# Patient Record
Sex: Male | Born: 1980 | Race: Black or African American | Hispanic: No | Marital: Married | Smoking: Never smoker
Health system: Southern US, Community
[De-identification: ages and names within clinical notes are randomized; demographics above are authoritative.]

## PROBLEM LIST (undated history)

## (undated) DIAGNOSIS — F329 Major depressive disorder, single episode, unspecified: Secondary | ICD-10-CM

## (undated) DIAGNOSIS — D689 Coagulation defect, unspecified: Secondary | ICD-10-CM

## (undated) DIAGNOSIS — K219 Gastro-esophageal reflux disease without esophagitis: Secondary | ICD-10-CM

## (undated) DIAGNOSIS — R0602 Shortness of breath: Secondary | ICD-10-CM

## (undated) DIAGNOSIS — R011 Cardiac murmur, unspecified: Secondary | ICD-10-CM

## (undated) DIAGNOSIS — J90 Pleural effusion, not elsewhere classified: Secondary | ICD-10-CM

## (undated) DIAGNOSIS — D6851 Activated protein C resistance: Secondary | ICD-10-CM

## (undated) DIAGNOSIS — G43909 Migraine, unspecified, not intractable, without status migrainosus: Secondary | ICD-10-CM

## (undated) DIAGNOSIS — I10 Essential (primary) hypertension: Secondary | ICD-10-CM

## (undated) DIAGNOSIS — F4310 Post-traumatic stress disorder, unspecified: Secondary | ICD-10-CM

## (undated) DIAGNOSIS — F419 Anxiety disorder, unspecified: Secondary | ICD-10-CM

## (undated) DIAGNOSIS — G4700 Insomnia, unspecified: Secondary | ICD-10-CM

## (undated) DIAGNOSIS — S069X9A Unspecified intracranial injury with loss of consciousness of unspecified duration, initial encounter: Secondary | ICD-10-CM

## (undated) DIAGNOSIS — E785 Hyperlipidemia, unspecified: Secondary | ICD-10-CM

## (undated) DIAGNOSIS — F32A Depression, unspecified: Secondary | ICD-10-CM

## (undated) DIAGNOSIS — I2699 Other pulmonary embolism without acute cor pulmonale: Secondary | ICD-10-CM

## (undated) HISTORY — DX: Cardiac murmur, unspecified: R01.1

## (undated) HISTORY — DX: Depression, unspecified: F32.A

## (undated) HISTORY — DX: Essential (primary) hypertension: I10

## (undated) HISTORY — DX: Insomnia, unspecified: G47.00

## (undated) HISTORY — DX: Coagulation defect, unspecified: D68.9

## (undated) HISTORY — DX: Hyperlipidemia, unspecified: E78.5

## (undated) HISTORY — DX: Major depressive disorder, single episode, unspecified: F32.9

## (undated) HISTORY — DX: Anxiety disorder, unspecified: F41.9

---

## 2008-05-11 DIAGNOSIS — S069X9A Unspecified intracranial injury with loss of consciousness of unspecified duration, initial encounter: Secondary | ICD-10-CM

## 2008-05-11 DIAGNOSIS — S069XAA Unspecified intracranial injury with loss of consciousness status unknown, initial encounter: Secondary | ICD-10-CM

## 2008-05-11 HISTORY — DX: Unspecified intracranial injury with loss of consciousness status unknown, initial encounter: S06.9XAA

## 2008-05-11 HISTORY — DX: Unspecified intracranial injury with loss of consciousness of unspecified duration, initial encounter: S06.9X9A

## 2010-05-02 ENCOUNTER — Emergency Department (HOSPITAL_COMMUNITY)
Admission: EM | Admit: 2010-05-02 | Discharge: 2010-05-03 | Payer: Self-pay | Source: Home / Self Care | Admitting: Emergency Medicine

## 2010-05-14 ENCOUNTER — Emergency Department (HOSPITAL_COMMUNITY)
Admission: EM | Admit: 2010-05-14 | Discharge: 2010-05-14 | Payer: Self-pay | Source: Home / Self Care | Admitting: Family Medicine

## 2010-07-21 LAB — COMPREHENSIVE METABOLIC PANEL
CO2: 25 mEq/L (ref 19–32)
Calcium: 9 mg/dL (ref 8.4–10.5)
GFR calc Af Amer: >60 mL/min (ref >60)
GFR calc non Af Amer: >60 mL/min (ref >60)
Glucose, Bld: 79 mg/dL (ref 70–99)
Potassium: 3.5 mEq/L (ref 3.5–5.1)
Sodium: 135 mEq/L (ref 135–145)
Total Bilirubin: 0.2 mg/dL — ABNORMAL LOW (ref 0.3–1.2)

## 2010-07-21 LAB — CBC
MCH: 28.7 pg (ref 26.0–34.0)
MCHC: 34.8 g/dL (ref 30.0–36.0)
MCV: 82.5 fL (ref 78.0–100.0)
RBC: 5.19 MIL/uL (ref 4.22–5.81)
RDW: 13 % (ref 11.5–15.5)

## 2010-07-21 LAB — DIFFERENTIAL
Basophils Relative: 0 % (ref 0–1)
Eosinophils Absolute: 0.1 10*3/uL (ref 0.0–0.7)
Eosinophils Relative: 1 % (ref 0–5)
Lymphocytes Relative: 66 % — ABNORMAL HIGH (ref 12–46)
Monocytes Absolute: 0.7 10*3/uL (ref 0.1–1.0)
Monocytes Relative: 8 % (ref 3–12)
Neutro Abs: 2.2 10*3/uL (ref 1.7–7.7)

## 2010-07-21 LAB — URINALYSIS, ROUTINE W REFLEX MICROSCOPIC
Bilirubin Urine: NEGATIVE
Glucose, UA: NEGATIVE mg/dL
Hgb urine dipstick: NEGATIVE
Specific Gravity, Urine: 1.03 (ref 1.005–1.030)

## 2010-07-21 LAB — APTT: aPTT: 43 seconds — ABNORMAL HIGH (ref 24–37)

## 2010-07-21 LAB — PROTIME-INR: INR: 1.22 (ref 0.00–1.49)

## 2011-04-23 ENCOUNTER — Emergency Department (HOSPITAL_COMMUNITY)
Admission: EM | Admit: 2011-04-23 | Discharge: 2011-04-23 | Disposition: A | Discharge status: Home / Self Care | Attending: Emergency Medicine | Admitting: Emergency Medicine

## 2011-04-23 ENCOUNTER — Encounter: Payer: Self-pay | Admitting: *Deleted

## 2011-04-23 ENCOUNTER — Emergency Department (HOSPITAL_COMMUNITY)

## 2011-04-23 DIAGNOSIS — K921 Melena: Secondary | ICD-10-CM | POA: Insufficient documentation

## 2011-04-23 DIAGNOSIS — R0602 Shortness of breath: Secondary | ICD-10-CM | POA: Insufficient documentation

## 2011-04-23 DIAGNOSIS — Z86718 Personal history of other venous thrombosis and embolism: Secondary | ICD-10-CM | POA: Insufficient documentation

## 2011-04-23 DIAGNOSIS — R0781 Pleurodynia: Secondary | ICD-10-CM

## 2011-04-23 DIAGNOSIS — R071 Chest pain on breathing: Secondary | ICD-10-CM | POA: Insufficient documentation

## 2011-04-23 DIAGNOSIS — R0601 Orthopnea: Secondary | ICD-10-CM | POA: Insufficient documentation

## 2011-04-23 DIAGNOSIS — R079 Chest pain, unspecified: Secondary | ICD-10-CM | POA: Insufficient documentation

## 2011-04-23 HISTORY — DX: Other pulmonary embolism without acute cor pulmonale: I26.99

## 2011-04-23 HISTORY — DX: Pleural effusion, not elsewhere classified: J90

## 2011-04-23 HISTORY — DX: Post-traumatic stress disorder, unspecified: F43.10

## 2011-04-23 HISTORY — DX: Migraine, unspecified, not intractable, without status migrainosus: G43.909

## 2011-04-23 LAB — BASIC METABOLIC PANEL
BUN: 12 mg/dL (ref 6–23)
CO2: 29 mEq/L (ref 19–32)
Calcium: 10.2 mg/dL (ref 8.4–10.5)
Chloride: 99 mEq/L (ref 96–112)
GFR calc non Af Amer: >90 mL/min (ref >90)
Potassium: 3.7 mEq/L (ref 3.5–5.1)
Sodium: 137 mEq/L (ref 135–145)

## 2011-04-23 LAB — CBC
HCT: 45.2 % (ref 39.0–52.0)
MCV: 81.6 fL (ref 78.0–100.0)
Platelets: 329 10*3/uL (ref 150–400)

## 2011-04-23 LAB — DIFFERENTIAL
Basophils Absolute: 0 10*3/uL (ref 0.0–0.1)
Basophils Relative: 0 % (ref 0–1)
Neutro Abs: 1.6 10*3/uL — ABNORMAL LOW (ref 1.7–7.7)

## 2011-04-23 LAB — OCCULT BLOOD X 1 CARD TO LAB, STOOL: Fecal Occult Bld: POSITIVE

## 2011-04-23 LAB — APTT: aPTT: 29 seconds (ref 24–37)

## 2011-04-23 MED ORDER — MORPHINE SULFATE 2 MG/ML IJ SOLN
INTRAMUSCULAR | Status: AC
  Filled 2011-04-23: qty 2

## 2011-04-23 MED ORDER — MORPHINE SULFATE 4 MG/ML IJ SOLN
4.0000 mg | Freq: Once | INTRAMUSCULAR | Status: AC
  Administered 2011-04-23: 4 mg via INTRAVENOUS

## 2011-04-23 MED ORDER — IOHEXOL 300 MG/ML  SOLN
100.0000 mL | Freq: Once | INTRAMUSCULAR | Status: AC | PRN
  Administered 2011-04-23: 100 mL via INTRAVENOUS

## 2011-04-23 MED ORDER — HYDROCODONE-ACETAMINOPHEN 5-500 MG PO TABS: 1.0000 | ORAL_TABLET | Freq: Four times a day (QID) | ORAL | Status: AC | PRN

## 2011-04-23 NOTE — ED Notes (Signed)
Report received from p.m. Shift---c/o SOA and pain thru chest as well as generalized abdominal pain--Gives history for P.E.'s--has been on Coumadin in the past but, not in a while--also reports bright red blood in stool---pulse ox is 99% on room air, lungs CTA

## 2011-04-23 NOTE — ED Notes (Signed)
Pt states "have had a cough x 2 wks, last wk coughed up white, had blood in stool & on toilet paper"; pt NAD

## 2011-04-23 NOTE — ED Provider Notes (Signed)
History     CSN: [id number] Arrival date & time: 04/23/2011  4:47 PM   First MD Initiated Contact with Patient 04/23/11 1937      Chief Complaint  Patient presents with  . Shortness of Breath    (Consider location/radiation/quality/duration/timing/severity/associated sxs/prior treatment) HPI Comments: States has history of pe, diagnosed at another facility about one year ago.  Was worked up for hypercoagulable state, no cause found.  Was on lovenox injections as coumadin was unable to achieve therapeutic results.  Started with blood in stool pain in the front of the chest, and shortness of breath which is what he had with previous pe.    Patient is a 30 y.o. male presenting with shortness of breath. The history is provided by the patient.  Shortness of Breath  The current episode started today. The problem has been gradually worsening. The problem is moderate. The symptoms are relieved by nothing. The symptoms are aggravated by nothing. Associated symptoms include chest pain, orthopnea and shortness of breath. Pertinent negatives include no fever, no stridor and no cough.    Past Medical History  Diagnosis Date  . Pulmonary embolism   . Pleural effusion   . PTSD (post-traumatic stress disorder)   . Migraines     History reviewed. No pertinent past surgical history.  No family history on file.  History  Substance Use Topics  . Smoking status: Never Smoker   . Smokeless tobacco: Not on file  . Alcohol Use: 1.2 oz/week    2 Glasses of wine per week      Review of Systems  Constitutional: Negative for fever.  Respiratory: Positive for shortness of breath. Negative for cough and stridor.   Cardiovascular: Positive for chest pain and orthopnea.  All other systems reviewed and are negative.    Allergies  Review of patient's allergies indicates no known allergies.  Home Medications   Current Outpatient Rx  Name Route Sig Dispense Refill  . CITALOPRAM HYDROBROMIDE 20  MG PO TABS Oral Take 20 mg by mouth daily.      Marland Kitchen PRAZOSIN HCL 5 MG PO CAPS Oral Take 20 mg by mouth at bedtime.      . TRAMADOL HCL 50 MG PO TABS Oral Take 50 mg by mouth every 6 (six) hours as needed. Maximum dose= 8 tablets per day; for pain.       BP 150/94  Pulse 98  Temp(Src) 98 F (36.7 C) (Oral)  Resp 18  Wt 193 lb (87.544 kg)  SpO2 98%  Physical Exam  Constitutional: He is oriented to person, place, and time. He appears well-developed and well-nourished. No distress.  HENT:  Head: Normocephalic and atraumatic.  Neck: Normal range of motion. Neck supple.  Cardiovascular: Normal rate and regular rhythm.  Exam reveals no gallop and no friction rub.   No murmur heard. Pulmonary/Chest: Effort normal and breath sounds normal. No respiratory distress. He has no wheezes.  Abdominal: Soft. Bowel sounds are normal. He exhibits no distension. There is no tenderness.  Musculoskeletal: Normal range of motion. He exhibits no edema.  Neurological: He is alert and oriented to person, place, and time.  Skin: Skin is warm and dry.    ED Course  Procedures (including critical care time)   Labs Reviewed  CBC  PROTIME-INR  BASIC METABOLIC PANEL  D-DIMER, QUANTITATIVE  DIFFERENTIAL  APTT  OCCULT BLOOD X 1 CARD TO LAB, STOOL   Dg Chest 2 View  04/23/2011  *RADIOLOGY REPORT*  Clinical Data: Shortness  of breath  CHEST - 2 VIEW  Comparison: 05/03/2010 and 05/02/2010  Findings: Cardiomediastinal silhouette is stable.  No acute infiltrate or pleural effusion.  No pulmonary edema.  Bony thorax is stable.  IMPRESSION: No active disease.  No significant change.  Original Report Authenticated By: Natasha Mead, M.D.     No diagnosis found.   Date: 04/23/2011  Rate: 92  Rhythm: normal sinus rhythm  QRS Axis: normal  Intervals: normal  ST/T Wave abnormalities: normal  Conduction Disutrbances:none  Narrative Interpretation:   Old EKG Reviewed: none available    MDM  The patient  presented complaining of pain in the front, left lower ribcage.  It was pleuritic in nature and was also complaining of having blood in his stool.  He tells me this is how he presented with a PE in the past.  Workup into PE was negative and no other cause for his symptoms was found on his CT scan or other tests.  His Hb was 16.  At this point, will discharge to home, to follow up with GI if blood continues.          Geoffery Lyons, MD 04/23/11 2136

## 2011-05-12 ENCOUNTER — Emergency Department (INDEPENDENT_AMBULATORY_CARE_PROVIDER_SITE_OTHER)

## 2011-05-12 ENCOUNTER — Encounter (HOSPITAL_COMMUNITY): Payer: Self-pay | Admitting: *Deleted

## 2011-05-12 ENCOUNTER — Emergency Department (INDEPENDENT_AMBULATORY_CARE_PROVIDER_SITE_OTHER)
Admission: EM | Admit: 2011-05-12 | Discharge: 2011-05-12 | Disposition: A | Discharge status: Home / Self Care | Source: Home / Self Care | Attending: Emergency Medicine | Admitting: Emergency Medicine

## 2011-05-12 DIAGNOSIS — M25579 Pain in unspecified ankle and joints of unspecified foot: Secondary | ICD-10-CM

## 2011-05-12 DIAGNOSIS — M25569 Pain in unspecified knee: Secondary | ICD-10-CM

## 2011-05-12 MED ORDER — MELOXICAM 7.5 MG PO TABS: 7.5000 mg | ORAL_TABLET | Freq: Every day | ORAL | Status: DC

## 2011-05-12 NOTE — ED Notes (Signed)
Left knee pain and left ankle pain onset of original injury April 2011 and reinjured dec 2011 - wrestling with children kicked in knee onset approx 2 days ago

## 2011-05-12 NOTE — ED Provider Notes (Addendum)
History     CSN: [id number]  Arrival date & time 05/12/11  1612   First MD Initiated Contact with Patient 05/12/11 1637      Chief Complaint  Patient presents with  . Knee Pain  . 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 It hurts a lot now"  Patient is a 31 y.o. male presenting with knee pain and ankle pain. The history is provided by the patient.  Knee Pain This is a recurrent problem. The current episode started 2 days ago. The problem occurs constantly. The problem has been rapidly improving. The symptoms are aggravated by twisting, bending and walking. Treatments tried: took one motrin yesterday. The treatment provided no relief.  Ankle Pain  Pertinent negatives include no numbness.    Past Medical History  Diagnosis Date  . Pulmonary embolism   . Pleural effusion   . PTSD (post-traumatic stress disorder)   . Migraines     History reviewed. No pertinent past surgical history.  History reviewed. No pertinent family history.  History  Substance Use Topics  . Smoking status: Never Smoker   . Smokeless tobacco: Not on file  . Alcohol Use: 1.2 oz/week    2 Glasses of wine per week      Review of Systems  Constitutional: Negative for fever.  Musculoskeletal: Negative for joint swelling.       Global knee pain and L ankle  Neurological: Negative for weakness and numbness.    Allergies  Review of patient's allergies indicates no known allergies.  Home Medications   Current Outpatient Rx  Name Route Sig Dispense Refill  . CITALOPRAM HYDROBROMIDE 20 MG PO TABS Oral Take 20 mg by mouth daily.      Marland Kitchen PRAZOSIN HCL 5 MG PO CAPS Oral Take  20 mg by mouth at bedtime.      . MELOXICAM 7.5 MG PO TABS Oral Take 1 tablet (7.5 mg total) by mouth daily. 14 tablet 0  . TRAMADOL HCL 50 MG PO TABS Oral Take 50 mg by mouth every 6 (six) hours as needed. Maximum dose= 8 tablets per day; for pain.       BP 153/95  Pulse 112  Temp(Src) 98.9 F (37.2 C) (Oral)  Resp 18  SpO2 99%  Physical Exam  Nursing note and vitals reviewed. Constitutional: He appears well-developed and well-nourished.  Musculoskeletal: He exhibits tenderness.       Left knee: He exhibits decreased range of motion and bony tenderness. He exhibits no swelling, no effusion, no ecchymosis, no deformity, no erythema, normal alignment, normal meniscus and no MCL laxity. tenderness found. Medial joint line and lateral joint line tenderness noted. No patellar tendon tenderness noted.       Left ankle: He exhibits decreased range of motion. He exhibits no swelling, no ecchymosis, no laceration and normal pulse. tenderness. Lateral malleolus and medial malleolus tenderness found. Achilles tendon normal. Achilles tendon exhibits no pain, no defect and normal Thompson's test results.  Skin: No abrasion, no bruising, no ecchymosis, no laceration and no rash noted. No erythema.    ED Course  Procedures (including critical care time)  Labs Reviewed - No data to display Dg  Ankle Complete Left  05/12/2011  *RADIOLOGY REPORT*  Clinical Data: Twisting injury.  Pain.  LEFT ANKLE COMPLETE - 3+ VIEW  Comparison: None.  Findings: There is no evidence for acute fracture or dislocation. No soft tissue foreign body or gas identified.  IMPRESSION: Negative exam.  Original Report Authenticated By: Patterson Hammersmith, M.D.   Dg Knee Complete 4 Views Left  05/12/2011  *RADIOLOGY REPORT*  Clinical Data: Injury.  Pain.  History of torn meniscus.  LEFT KNEE - COMPLETE 4+ VIEW  Comparison: None.  Findings: Four views are performed, showing presence of joint effusion.  No evidence for acute fracture,  subluxation.  No significant degenerative change.  IMPRESSION: Joint effusion.  If pain persists, consider follow-up MRI to evaluate for internal derangement.  Original Report Authenticated By: Patterson Hammersmith, M.D.     1. Chronic knee pain   2. 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 To establish with local PCP in March. Feels like aggravated these injuries recently while wrestlng with his son 2 days ago. Exam is somewhat non-specific- pain is easily elicited with mild superficial touch- no deformities no sts. Disproportionate lack of ROM- X-rays obtained for hx purpose and for follow-up with orthopedic provider-GIVEN HISTORY OF MENISCAL TEAR AND MILD KNEE EFFUSION- AND RESTRICTED ROM        Jimmie Molly, MD 05/12/11 1759    Jimmie Molly, MD 05/12/11 [phone]

## 2011-07-10 ENCOUNTER — Ambulatory Visit (INDEPENDENT_AMBULATORY_CARE_PROVIDER_SITE_OTHER): Admitting: Licensed Clinical Social Worker

## 2011-07-10 DIAGNOSIS — F4310 Post-traumatic stress disorder, unspecified: Secondary | ICD-10-CM

## 2011-07-10 DIAGNOSIS — F331 Major depressive disorder, recurrent, moderate: Secondary | ICD-10-CM

## 2011-07-10 DIAGNOSIS — F411 Generalized anxiety disorder: Secondary | ICD-10-CM

## 2011-07-13 ENCOUNTER — Telehealth: Payer: Self-pay | Admitting: *Deleted

## 2011-07-13 NOTE — Telephone Encounter (Signed)
Rec'd call back from referring office, patient does not actively have known clots, but due to hx/risk factors of PE/DVTs, Dr Renae Gloss started patient back on injections until we can do consult with further recommendations. Orders/POF to scheduling to establish patient.

## 2011-07-13 NOTE — Telephone Encounter (Signed)
Received referral on 2/22 requesting consult for PE. Messages left with Tawanda in scheduling and Nurse Asher Muir with Dr Renae Gloss for further information and clarification. 3rd attempt made to referring office to retrieve needed information on voice mail for Harold Cohen/Harold Cohen. Appt can not be made until further clarification and records related to diagnosis of PE have been rec'd. Attempted to call patient to get information, no answer.

## 2011-07-15 ENCOUNTER — Telehealth: Payer: Self-pay | Admitting: Hematology and Oncology

## 2011-07-15 NOTE — Telephone Encounter (Signed)
S/w pt re appt for 3/13 @ 10 am

## 2011-07-16 ENCOUNTER — Telehealth: Payer: Self-pay | Admitting: Hematology and Oncology

## 2011-07-16 NOTE — Telephone Encounter (Signed)
Referred by Bebe Liter Dx- HX DVT/PE

## 2011-07-20 ENCOUNTER — Ambulatory Visit (INDEPENDENT_AMBULATORY_CARE_PROVIDER_SITE_OTHER): Admitting: Licensed Clinical Social Worker

## 2011-07-20 DIAGNOSIS — F411 Generalized anxiety disorder: Secondary | ICD-10-CM

## 2011-07-20 DIAGNOSIS — F331 Major depressive disorder, recurrent, moderate: Secondary | ICD-10-CM

## 2011-07-20 DIAGNOSIS — F4310 Post-traumatic stress disorder, unspecified: Secondary | ICD-10-CM

## 2011-07-22 ENCOUNTER — Ambulatory Visit (HOSPITAL_BASED_OUTPATIENT_CLINIC_OR_DEPARTMENT_OTHER): Admitting: Hematology and Oncology

## 2011-07-22 ENCOUNTER — Ambulatory Visit

## 2011-07-22 ENCOUNTER — Encounter: Payer: Self-pay | Admitting: Hematology and Oncology

## 2011-07-22 ENCOUNTER — Other Ambulatory Visit: Payer: Self-pay | Admitting: *Deleted

## 2011-07-22 ENCOUNTER — Ambulatory Visit (HOSPITAL_BASED_OUTPATIENT_CLINIC_OR_DEPARTMENT_OTHER): Admitting: Lab

## 2011-07-22 ENCOUNTER — Telehealth: Payer: Self-pay | Admitting: Hematology and Oncology

## 2011-07-22 VITALS — BP 129/82 | HR 88 | Temp 97.0°F | Ht 72.0 in | Wt 194.5 lb

## 2011-07-22 DIAGNOSIS — I749 Embolism and thrombosis of unspecified artery: Secondary | ICD-10-CM

## 2011-07-22 DIAGNOSIS — Z7901 Long term (current) use of anticoagulants: Secondary | ICD-10-CM

## 2011-07-22 DIAGNOSIS — Z86711 Personal history of pulmonary embolism: Secondary | ICD-10-CM

## 2011-07-22 LAB — CBC WITH DIFFERENTIAL/PLATELET
Basophils Absolute: 0 10*3/uL (ref 0.0–0.1)
EOS%: 1 % (ref 0.0–7.0)
Eosinophils Absolute: 0 10*3/uL (ref 0.0–0.5)
HGB: 15.9 g/dL (ref 13.0–17.1)
LYMPH%: 59 % — ABNORMAL HIGH (ref 14.0–49.0)
MCH: 28.7 pg (ref 27.2–33.4)
MCV: 86.8 fL (ref 79.3–98.0)
MONO%: 6.3 % (ref 0.0–14.0)
NEUT#: 1.6 10*3/uL (ref 1.5–6.5)
Platelets: 289 10*3/uL (ref 140–400)

## 2011-07-22 MED ORDER — ENOXAPARIN SODIUM 120 MG/0.8ML ~~LOC~~ SOLN: 120.0000 mg | SUBCUTANEOUS | Status: DC

## 2011-07-22 NOTE — Telephone Encounter (Signed)
appts made and printed for pt and pt sent back to the lab

## 2011-07-22 NOTE — Patient Instructions (Signed)
Patient to follow up as instructed.   Current Outpatient Prescriptions  Medication Sig Dispense Refill  . atorvastatin (LIPITOR) 10 MG tablet Take 10 mg by mouth daily.      Marland Kitchen bismuth-metronidazole-tetracycline (PLYERA) 140-125-125 MG per capsule Take 3 capsules by mouth 4 (four) times daily.      . citalopram (CELEXA) 20 MG tablet Take 20 mg by mouth daily.        Marland Kitchen enoxaparin (LOVENOX) 40 MG/0.4ML SOLN Inject 40 mg into the skin daily.      . ergocalciferol (VITAMIN D2) 50000 UNITS capsule Take 50,000 Units by mouth as directed. Take 16109 units on  Tues.  &  Thurs.      . Multiple Vitamin (MULTIVITAMIN) tablet Take 2 tablets by mouth daily.      Marland Kitchen omeprazole (PRILOSEC) 40 MG capsule Take 40 mg by mouth daily.      . prazosin (MINIPRESS) 2 MG capsule Take 8 mg by mouth at bedtime.      Marland Kitchen QUEtiapine (SEROQUEL) 25 MG tablet Take 25 mg by mouth 2 (two) times daily.      . traMADol (ULTRAM) 50 MG tablet Take 50 mg by mouth every 6 (six) hours as needed. Maximum dose= 8 tablets per day; for pain.             March 2013  Sunday Monday Tuesday Wednesday Thursday Friday Saturday                  1   2    3   4   5   6   7   8   9    10   11   12   13    FINANCIAL COUNSELING  10:00 AM  (30 min.)  [facility]  [facility]   NEW PATIENT 60  10:30 AM  (60 min.)  Jontavia Leatherbury I Anani Gu, MD  [facility] 14   CT BODY W/  11:30 AM  (30 min.)  Wl-Ct 2  [facility] 15   16    17   18   19   20   21    EST PT 30  10:30 AM  (30 min.)  Laurice Record, MD  [facility] 22   23    24   25   26   27   28   29   30    31                            April 2013  Sunday Monday Tuesday Wednesday Thursday Friday Saturday      1   2   3   4   5   6    7   8   9   10   11    NEW PATIENT  10:20 AM  (20 min.)  Wilmon Arms. Corliss Skains, MD  [facility] 12   13    14   15   16   17   18   19   20    21   22   23   24   25   26   27    28   29    30

## 2011-07-22 NOTE — Progress Notes (Signed)
Dr.    Maggie Font      -        Primary. Dr.    Chip Boer      -      GI Dr.    Thomasena Edis        [facility]  Orthopedic Dr.    Lottie Dawson     [facility] [place]   Neurologic    [place]  [facility]   On   [address]  Cell    Phone       [phone].

## 2011-07-22 NOTE — Progress Notes (Signed)
This office note has been dictated.

## 2011-07-23 ENCOUNTER — Other Ambulatory Visit: Payer: Self-pay

## 2011-07-23 ENCOUNTER — Ambulatory Visit (HOSPITAL_COMMUNITY)
Admission: RE | Admit: 2011-07-23 | Discharge: 2011-07-23 | Disposition: A | Discharge status: Home / Self Care | Source: Ambulatory Visit | Attending: Hematology and Oncology | Admitting: Hematology and Oncology

## 2011-07-23 ENCOUNTER — Emergency Department (HOSPITAL_COMMUNITY)

## 2011-07-23 ENCOUNTER — Encounter (HOSPITAL_COMMUNITY): Payer: Self-pay

## 2011-07-23 ENCOUNTER — Emergency Department (HOSPITAL_COMMUNITY)
Admission: EM | Admit: 2011-07-23 | Discharge: 2011-07-23 | Disposition: A | Discharge status: Home / Self Care | Attending: Emergency Medicine | Admitting: Emergency Medicine

## 2011-07-23 DIAGNOSIS — K429 Umbilical hernia without obstruction or gangrene: Secondary | ICD-10-CM | POA: Insufficient documentation

## 2011-07-23 DIAGNOSIS — N400 Enlarged prostate without lower urinary tract symptoms: Secondary | ICD-10-CM | POA: Insufficient documentation

## 2011-07-23 DIAGNOSIS — R000 Tachycardia, unspecified: Secondary | ICD-10-CM | POA: Insufficient documentation

## 2011-07-23 DIAGNOSIS — R110 Nausea: Secondary | ICD-10-CM | POA: Insufficient documentation

## 2011-07-23 DIAGNOSIS — E860 Dehydration: Secondary | ICD-10-CM | POA: Insufficient documentation

## 2011-07-23 DIAGNOSIS — Z79899 Other long term (current) drug therapy: Secondary | ICD-10-CM | POA: Insufficient documentation

## 2011-07-23 DIAGNOSIS — R109 Unspecified abdominal pain: Secondary | ICD-10-CM | POA: Insufficient documentation

## 2011-07-23 DIAGNOSIS — R0602 Shortness of breath: Secondary | ICD-10-CM | POA: Insufficient documentation

## 2011-07-23 DIAGNOSIS — K7689 Other specified diseases of liver: Secondary | ICD-10-CM | POA: Insufficient documentation

## 2011-07-23 DIAGNOSIS — R197 Diarrhea, unspecified: Secondary | ICD-10-CM | POA: Insufficient documentation

## 2011-07-23 DIAGNOSIS — R0789 Other chest pain: Secondary | ICD-10-CM | POA: Insufficient documentation

## 2011-07-23 DIAGNOSIS — Z86718 Personal history of other venous thrombosis and embolism: Secondary | ICD-10-CM | POA: Insufficient documentation

## 2011-07-23 DIAGNOSIS — I749 Embolism and thrombosis of unspecified artery: Secondary | ICD-10-CM

## 2011-07-23 DIAGNOSIS — K625 Hemorrhage of anus and rectum: Secondary | ICD-10-CM | POA: Insufficient documentation

## 2011-07-23 LAB — TROPONIN I: Troponin I: <0.3 ng/mL (ref <0.30)

## 2011-07-23 LAB — DIFFERENTIAL
Eosinophils Absolute: 0 10*3/uL (ref 0.0–0.7)
Eosinophils Relative: 0 % (ref 0–5)
Lymphs Abs: 3.5 10*3/uL (ref 0.7–4.0)
Monocytes Absolute: 0.7 10*3/uL (ref 0.1–1.0)
Monocytes Relative: 9 % (ref 3–12)
Neutrophils Relative %: 46 % (ref 43–77)

## 2011-07-23 LAB — COMPREHENSIVE METABOLIC PANEL
ALT: 39 U/L (ref 0–53)
AST: 40 U/L — ABNORMAL HIGH (ref 0–37)
Albumin: 5 g/dL (ref 3.5–5.2)
Alkaline Phosphatase: 49 U/L (ref 39–117)
BUN: 9 mg/dL (ref 6–23)
CO2: 26 mEq/L (ref 19–32)
Calcium: 10.5 mg/dL (ref 8.4–10.5)
Chloride: 99 mEq/L (ref 96–112)
Creatinine, Ser: 0.87 mg/dL (ref 0.50–1.35)
GFR calc Af Amer: >90 mL/min (ref >90)
GFR calc non Af Amer: >90 mL/min (ref >90)
Glucose, Bld: 150 mg/dL — ABNORMAL HIGH (ref 70–99)
Potassium: 3.8 mEq/L (ref 3.5–5.1)
Sodium: 139 mEq/L (ref 135–145)
Total Bilirubin: 0.3 mg/dL (ref 0.3–1.2)
Total Protein: 9.2 g/dL — ABNORMAL HIGH (ref 6.0–8.3)

## 2011-07-23 LAB — CBC
HCT: 49.7 % (ref 39.0–52.0)
Hemoglobin: 17.3 g/dL — ABNORMAL HIGH (ref 13.0–17.0)
MCH: 29.1 pg (ref 26.0–34.0)
MCHC: 34.8 g/dL (ref 30.0–36.0)
MCV: 83.7 fL (ref 78.0–100.0)
Platelets: 353 10*3/uL (ref 150–400)
RBC: 5.94 MIL/uL — ABNORMAL HIGH (ref 4.22–5.81)
RDW: 12.7 % (ref 11.5–15.5)
WBC: 7.7 10*3/uL (ref 4.0–10.5)

## 2011-07-23 LAB — LIPASE, BLOOD: Lipase: 47 U/L (ref 11–59)

## 2011-07-23 LAB — PROTIME-INR: Prothrombin Time: 12.4 seconds (ref 11.6–15.2)

## 2011-07-23 LAB — RAPID URINE DRUG SCREEN, HOSP PERFORMED
Barbiturates: NOT DETECTED
Cocaine: NOT DETECTED

## 2011-07-23 MED ORDER — SODIUM CHLORIDE 0.9 % IV BOLUS (SEPSIS)
1000.0000 mL | Freq: Once | INTRAVENOUS | Status: AC
  Administered 2011-07-23: 1000 mL via INTRAVENOUS

## 2011-07-23 MED ORDER — LORAZEPAM 2 MG/ML IJ SOLN: 1.0000 mg | Freq: Once | INTRAMUSCULAR | Status: DC

## 2011-07-23 MED ORDER — ASPIRIN 81 MG PO CHEW
324.0000 mg | CHEWABLE_TABLET | Freq: Once | ORAL | Status: AC
  Administered 2011-07-23: 324 mg via ORAL
  Filled 2011-07-23: qty 4

## 2011-07-23 MED ORDER — IOHEXOL 300 MG/ML  SOLN
100.0000 mL | Freq: Once | INTRAMUSCULAR | Status: AC | PRN
  Administered 2011-07-23: 100 mL via INTRAVENOUS

## 2011-07-23 MED ORDER — METOCLOPRAMIDE HCL 5 MG/ML IJ SOLN: 10.0000 mg | Freq: Once | INTRAMUSCULAR | Status: AC

## 2011-07-23 MED ORDER — NITROGLYCERIN 0.4 MG SL SUBL
0.4000 mg | SUBLINGUAL_TABLET | SUBLINGUAL | Status: DC | PRN
  Administered 2011-07-23: 0.4 mg via SUBLINGUAL
  Filled 2011-07-23: qty 25

## 2011-07-23 MED ORDER — ONDANSETRON HCL 4 MG/2ML IJ SOLN
4.0000 mg | Freq: Once | INTRAMUSCULAR | Status: AC
  Administered 2011-07-23: 4 mg via INTRAVENOUS
  Filled 2011-07-23: qty 2

## 2011-07-23 MED ORDER — METOCLOPRAMIDE HCL 5 MG/ML IJ SOLN
INTRAMUSCULAR | Status: AC
  Administered 2011-07-23: 15:00:00
  Filled 2011-07-23: qty 2

## 2011-07-23 MED ORDER — IOHEXOL 300 MG/ML  SOLN
100.0000 mL | Freq: Once | INTRAMUSCULAR | Status: AC | PRN
  Administered 2011-07-23: 80 mL via INTRAVENOUS

## 2011-07-23 MED ORDER — MORPHINE SULFATE 4 MG/ML IJ SOLN: 4.0000 mg | Freq: Once | INTRAMUSCULAR | Status: AC

## 2011-07-23 MED ORDER — MORPHINE SULFATE 4 MG/ML IJ SOLN
INTRAMUSCULAR | Status: AC
  Administered 2011-07-23: 15:00:00
  Filled 2011-07-23: qty 1

## 2011-07-23 MED ORDER — MORPHINE SULFATE 4 MG/ML IJ SOLN
4.0000 mg | Freq: Once | INTRAMUSCULAR | Status: AC
  Administered 2011-07-23: 4 mg via INTRAVENOUS
  Filled 2011-07-23: qty 1

## 2011-07-23 MED ORDER — ALPRAZOLAM 0.5 MG PO TABS: 0.5000 mg | ORAL_TABLET | Freq: Three times a day (TID) | ORAL | Status: AC | PRN

## 2011-07-23 NOTE — Progress Notes (Signed)
CC:   Robyn N. Allyne Gee, M.D. Jordan Hawks Elnoria Howard, MD [facility]  IDENTIFYING STATEMENT:  The patient is a 31 year old man seen at the request of Dr. Lelon Perla with history of pulmonary embolism.  HISTORY OF PRESENT ILLNESS:  The patient states that he had initially presented with chest pain and was diagnosed with bilateral pulmonary emboli in October 2001.  He was hospitalized for approximately 2 weeks in [place], [place], at [facility].  He was managed with heparin and eventually bridged to Coumadin for a year.  He reports that lower extremity deep vein thrombosis was negative for blood clots.  He then reports that following a year of anticoagulation, blood work had determined that he required lifelong anticoagulation.  He is unsure of which coagulation factors were affected.  He does not have office records at hand.  He was placed on Lovenox 40 mg daily 2 weeks ago by Dr. Zella Ball PA. He also gives a 21-month history of rectal bleeding associated with rectal discomfort on defecation.  He is also notes nausea and vomiting. On April 23, 2011, he had received a CT angiogram that had shown no evidence for acute pulmonary emboli.  In addition, his heart and lungs were unremarkable.  There was no adenopathy.  The patient reports that Dr. Lelon Perla had referred him to see Dr. Elnoria Howard for a GI evaluation.  On 06/23/2011, he received an endoscopy and colonoscopy.  I do not have those results, but the patient reports that on endoscopy he was found to be H pylori positive.  He is on antibiotics and a proton pump inhibitor.  Colonoscopy had revealed internal hemorrhoids.  He has a history of posttraumatic stress disorder.  He has serviced [place], [place], and [place]. He was medically discharged in September 2012.  PAST MEDICAL HISTORY: 1. Posttraumatic stress disorder with anxiety. 2. Bilateral pulmonary emboli diagnosed in 2011. 3. H pylori gastritis.  ALLERGIES:   None.  MEDICATIONS:  Lovenox 40 mg daily, multivitamin 2 tablets daily, Celexa 20 mg daily, vitamin D2 50,000 units Tuesday and Thursday, prazosin 2 mg 1 cap q.h.s., Pylera 140 mg 2 caps q.i.d., Prilosec 40 mg daily, Lipitor 10 mg daily, Seroquel 25 mg b.i.d.  SOCIAL HISTORY:  The patient is married with 1 child.  He denies tobacco use.  He drinks socially.  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  He served in [place], [place], and [place].  FAMILY HISTORY:  The patient's mother is felt to have a hypercoagulable factor deficiency.  There is no family history for oncologic or hematologic malignancies.  REVIEW OF SYSTEMS:  Constitutional:  Denies fever, chills, night sweats, anorexia, weight loss.  Cardiovascular:  Denies chest pain, PND, orthopnea, ankle swelling.  Respirations:  Denies cough, hemoptysis, wheeze, shortness of breath.  GI:  Denies nausea, vomiting, abdominal pain, diarrhea, melena, hematochezia.  GU:  Denies dysuria, hematuria, nocturia, frequency.  Skin:  No bruising or bleeding.  Rest of review of systems negative.  PHYSICAL EXAMINATION:  General:  The patient is a well-appearing, well- nourished man in no distress.  Vitals:  Pulse 88, blood pressure 129/82, temperature 97, respirations 20, weight 194 pounds.  HEENT:  Head is atraumatic, normocephalic.  Sclerae anicteric.  Mouth moist.  Neck: Supple.  Chest:  Clear to percussion and auscultation.  CVS:  First and second heart sounds present.  No added sounds or murmurs.  Abdomen: Soft and nontender.  Bowel sounds present.  Extremities:  No calf tenderness.  Pulses present,  symmetrical.  CNS:  Nonfocal.  IMPRESSION AND PLAN:  Mr. Longenecker is a 31 year old man with a history of bilateral pulmonary emboli diagnosed in October 2011 in [place], [place].  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  The patient is currently on prophylactic dose of Lovenox initiated 2 weeks ago.  He needs to be on a higher dose at 1.5 mg/kg daily, which comes up to about 120 mg daily.  In the interim, the patient will return to the lab to have a full hypercoagulable workup and to also include a CBC with comprehensive metabolic panel.  The patient will also have a CT scan of the abdomen and pelvis, as he is complaining of low abdominal discomfort.  Also refer him to general surgeon because he appears to have what sounds like painful internal hemorrhoids.  The patient returns in a week's time to discuss the lab and CT scan findings.    ______________________________ Laurice Record, M.D. LIO/MEDQ  D:  07/22/2011  T:  07/22/2011  Job:  [id number]

## 2011-07-23 NOTE — Discharge Instructions (Signed)
Chest Pain (Nonspecific) It is often hard to give a specific diagnosis for the cause of chest pain. There is always a chance that your pain could be related to something serious, such as a heart attack or a blood clot in the lungs. You need to follow up with your caregiver for further evaluation. CAUSES   Heartburn.   Pneumonia or bronchitis.   Anxiety or stress.   Inflammation around your heart (pericarditis) or lung (pleuritis or pleurisy).   A blood clot in the lung.   A collapsed lung (pneumothorax). It can develop suddenly on its own (spontaneous pneumothorax) or from injury (trauma) to the chest.   Shingles infection (herpes zoster virus).  The chest wall is composed of bones, muscles, and cartilage. Any of these can be the source of the pain.  The bones can be bruised by injury.   The muscles or cartilage can be strained by coughing or overwork.   The cartilage can be affected by inflammation and become sore (costochondritis).  DIAGNOSIS  Lab tests or other studies, such as X-rays, electrocardiography, stress testing, or cardiac imaging, may be needed to find the cause of your pain.  TREATMENT   Treatment depends on what may be causing your chest pain. Treatment may include:   Acid blockers for heartburn.   Anti-inflammatory medicine.   Pain medicine for inflammatory conditions.   Antibiotics if an infection is present.   You may be advised to change lifestyle habits. This includes stopping smoking and avoiding alcohol, caffeine, and chocolate.   You may be advised to keep your head raised (elevated) when sleeping. This reduces the chance of acid going backward from your stomach into your esophagus.   Most of the time, nonspecific chest pain will improve within 2 to 3 days with rest and mild pain medicine.  HOME CARE INSTRUCTIONS   If antibiotics were prescribed, take your antibiotics as directed. Finish them even if you start to feel better.   For the next few  days, avoid physical activities that bring on chest pain. Continue physical activities as directed.   Do not smoke.   Avoid drinking alcohol.   Only take over-the-counter or prescription medicine for pain, discomfort, or fever as directed by your caregiver.   Follow your caregiver's suggestions for further testing if your chest pain does not go away.   Keep any follow-up appointments you made. If you do not go to an appointment, you could develop lasting (chronic) problems with pain. If there is any problem keeping an appointment, you must call to reschedule.  SEEK MEDICAL CARE IF:   You think you are having problems from the medicine you are taking. Read your medicine instructions carefully.   Your chest pain does not go away, even after treatment.   You develop a rash with blisters on your chest.  SEEK IMMEDIATE MEDICAL CARE IF:   You have increased chest pain or pain that spreads to your arm, neck, jaw, back, or abdomen.   You develop shortness of breath, an increasing cough, or you are coughing up blood.   You have severe back or abdominal pain, feel nauseous, or vomit.   You develop severe weakness, fainting, or chills.   You have a fever.  THIS IS AN EMERGENCY. Do not wait to see if the pain will go away. Get medical help at once. Call your local emergency services (911 in [place] Do not drive yourself to the hospital. MAKE SURE YOU:   Understand these instructions.     Will watch your condition.   Will get help right away if you are not doing well or get worse.  Document Released: 02/04/2005 Document Revised: 04/16/2011 Document Reviewed: 12/01/2007 [employer] Patient Information 2012 [employer]. 

## 2011-07-23 NOTE — ED Notes (Signed)
Pt states had a CT scan done this am of abd and pelvic, states saw PCP d/t chest pressure x1wk. On arrival pt is diaphoretic and vomiting. EKG done on arrival.

## 2011-07-23 NOTE — ED Notes (Signed)
Pt states he does not need ativan. Pt state he is ok. Pt hr stable at this time

## 2011-07-23 NOTE — ED Provider Notes (Signed)
History     CSN: [id number]  Arrival date & time 07/23/11  1350   First MD Initiated Contact with Patient 07/23/11 1425      Chief Complaint  Patient presents with  . Chest Pain    (Consider location/radiation/quality/duration/timing/severity/associated sxs/prior treatment) HPI  30yoM h/o PE on lovenox, PTSD, migraines pw chest tightness. Patient states that he woke up this morning with left chest tightness. States that it has gradually worsened throughout the day. C/O SOB only when pain severe. +nausea and NBNB emesis in ED only. +diaphoresis. Denies diarrhea. States that he's experienced BRB in his stool x one month and recent EGD/colonoscopy unremarkable x internal hemorrhoids. He was restarted on lovenox 2 weeks ago (after being off anticoag  Since Sept 2012). No leg pain/swelling. States that he's experienced LUQ pain x "a while now" and today had CT A/P done.   Non smoker, no h/o HTN, DM +HLD, +fmhx CAD  Recently diagnosed with h. pylori  Past Medical History  Diagnosis Date  . Pulmonary embolism   . Pleural effusion   . PTSD (post-traumatic stress disorder)   . Migraines     History reviewed. No pertinent past surgical history.  No family history on file.  History  Substance Use Topics  . Smoking status: Never Smoker   . Smokeless tobacco: Not on file  . Alcohol Use: 1.2 oz/week    2 Glasses of wine per week      Review of Systems  All other systems reviewed and are negative.   except as noted HPI   Allergies  Review of patient's allergies indicates no known allergies.  Home Medications   Current Outpatient Rx  Name Route Sig Dispense Refill  . ALPRAZOLAM 0.5 MG PO TABS Oral Take 1 tablet (0.5 mg total) by mouth 3 (three) times daily as needed for sleep. 10 tablet 0  . ATORVASTATIN CALCIUM 10 MG PO TABS Oral Take 10 mg by mouth daily.    Marland Kitchen BIS SUBCIT-METRONID-TETRACYC 140-125-125 MG PO CAPS Oral Take 3 capsules by mouth 4 (four) times daily.    Marland Kitchen  CITALOPRAM HYDROBROMIDE 20 MG PO TABS Oral Take 20 mg by mouth daily.      Marland Kitchen ENOXAPARIN SODIUM 120 MG/0.8ML [place] SOLN Subcutaneous Inject 0.8 mLs (120 mg total) into the skin daily. 30 Syringe 1    Gave pt original rx at visit today  07/22/11.  Marland Kitchen ERGOCALCIFEROL 50000 UNITS PO CAPS Oral Take 50,000 Units by mouth as directed. Take 78469 units on  Tues.  &  Thurs.    Marland Kitchen ONE-DAILY MULTI VITAMINS PO TABS Oral Take 2 tablets by mouth daily.    Marland Kitchen OMEPRAZOLE 40 MG PO CPDR Oral Take 40 mg by mouth daily.    Marland Kitchen PRAZOSIN HCL 2 MG PO CAPS Oral Take 8 mg by mouth at bedtime.    Marland Kitchen QUETIAPINE FUMARATE 25 MG PO TABS Oral Take 25 mg by mouth 2 (two) times daily.    . TRAMADOL HCL 50 MG PO TABS Oral Take 50 mg by mouth every 6 (six) hours as needed. Maximum dose= 8 tablets per day; for pain.       BP 127/77  Pulse 85  Temp(Src) 98 F (36.7 C) (Oral)  Resp 23  SpO2 100%  Physical Exam  Nursing note and vitals reviewed. Constitutional: He is oriented to person, place, and time. He appears well-developed and well-nourished. No distress.       Appears to be in pain diaphoretic  HENT:  Head: Atraumatic.       Mm dry  Eyes: Conjunctivae are normal. Pupils are equal, round, and reactive to light.  Neck: Neck supple.  Cardiovascular: Normal rate, regular rhythm, normal heart sounds and intact distal pulses.  Exam reveals no gallop and no friction rub.   No murmur heard. Pulmonary/Chest: Effort normal. No respiratory distress. He has no wheezes. He has no rales. He exhibits no tenderness.  Abdominal: Soft. Bowel sounds are normal. There is no tenderness. There is no rebound and no guarding.  Musculoskeletal: Normal range of motion. He exhibits no edema and no tenderness.       No calf ttp  Neurological: He is alert and oriented to person, place, and time.  Skin: Skin is warm and dry.  Psychiatric: He has a normal mood and affect.    Date: 07/23/2011  Rate: 166  Rhythm: sinus tachycardia  QRS Axis: normal   Intervals: normal  ST/T Wave abnormalities: normal  Conduction Disutrbances:none  Narrative Interpretation:   Old EKG Reviewed: changes noted   Date: 07/23/2011  Rate: 83  Rhythm: normal sinus rhythm  QRS Axis: normal  Intervals: normal  ST/T Wave abnormalities: nonspecific T wave changes t wave flattening lateral leads  Conduction Disutrbances:none  Narrative Interpretation:   Old EKG Reviewed: changes noted   ED Course  Procedures (including critical care time)  Labs Reviewed  CBC - Abnormal; Notable for the following:    RBC 5.94 (*)    Hemoglobin 17.3 (*)    All other components within normal limits  COMPREHENSIVE METABOLIC PANEL - Abnormal; Notable for the following:    Glucose, Bld 150 (*)    Total Protein 9.2 (*)    AST 40 (*)    All other components within normal limits  URINE RAPID DRUG SCREEN (HOSP PERFORMED) - Abnormal; Notable for the following:    Opiates POSITIVE (*)    All other components within normal limits  DIFFERENTIAL  LIPASE, BLOOD  TROPONIN I  PROTIME-INR  TSH   Ct Angio Chest W/cm &/or Wo Cm  07/23/2011  *RADIOLOGY REPORT*  Clinical Data: Short of breath with history of pulmonary embolism. Chest pressure.  CT ANGIOGRAPHY CHEST  Technique:  Multidetector CT imaging of the chest using the standard protocol during bolus administration of intravenous contrast. Multiplanar reconstructed images including MIPs were obtained and reviewed to evaluate the vascular anatomy.  Contrast: 80mL OMNIPAQUE IOHEXOL 300 MG/ML IJ SOLN  Comparison: Plain film of 07/23/2011.  Chest CT of 04/23/2011.  Findings: Lung windows demonstrate no nodules, airspace opacities.  Soft tissue windows:  The quality of this exam for evaluation of pulmonary embolism is good. No evidence of pulmonary embolism.  Normal aortic caliber without dissection.  Normal heart size without pericardial or pleural effusion.  Soft tissue density in the anterior mediastinum is likely residual thymus.  No  mediastinal or hilar adenopathy.  Limited abdominal imaging demonstrates probable mild hepatic steatosis. No acute osseous abnormality.  IMPRESSION: No evidence of pulmonary embolism.  No explanation for chest pain.  Possible mild hepatic steatosis.  Original Report Authenticated By: Consuello Bossier, M.D.   Ct Abdomen Pelvis W Contrast  07/23/2011  *RADIOLOGY REPORT*  Clinical Data: Abdominal pain, rectal bleeding, nausea, diarrhea  CT ABDOMEN AND PELVIS WITH CONTRAST  Technique:  Multidetector CT imaging of the abdomen and pelvis was performed following the standard protocol during bolus administration of intravenous contrast.  Contrast: OMNIPAQUE IOHEXOL 300 MG/ML IJ SOLN  Comparison: [place] CT abdomen pelvis  dated 05/03/2010  Findings: Mild patchy opacity at the left lung base, improved, likely reflecting atelectasis.  Suspected hepatic steatosis with focal fatty sparing along the gallbladder fossa (series 2/image 32).  Spleen, pancreas, and adrenal glands within normal limits.  Gallbladder is underdistended.  No intrahepatic or extrahepatic ductal dilatation.  Kidneys are within normal limits.  No hydronephrosis.  No evidence of bowel obstruction.  Normal appendix.  Mild narrowing/eccentric colonic wall thickening in the rectosigmoid region (series 2/image 74; coronal image 65).  No evidence of abdominal aortic aneurysm.  No abdominopelvic ascites.  No suspicious abdominopelvic lymphadenopathy.  Mild prostatomegaly, measuring 4.8 cm in transverse dimension.  Bladder is unremarkable.  Tiny fat-containing periumbilical hernia.  Visualized osseous structures are within normal limits.  IMPRESSION: Normal appendix.  No evidence of bowel obstruction.  Mild narrowing/eccentric colonic wall thickening in the rectosigmoid region.  While this may reflect peristalsis/underdistension, given the history of rectal bleeding, colonoscopy is suggested.  Hepatic steatosis.  Additional ancillary findings as above.   Original Report Authenticated By: Charline Bills, M.D.   Dg Chest Portable 1 View  07/23/2011  *RADIOLOGY REPORT*  Clinical Data: Chest pain, shortness of breath.  PORTABLE CHEST - 1 VIEW  Comparison: 04/23/2011  Findings: Low lung volumes with minimal bibasilar atelectasis. Heart is normal size.  No effusions or acute bony abnormality.  IMPRESSION: Low lung volumes, bibasilar atelectasis.  Original Report Authenticated By: Cyndie Chime, M.D.    1. Chest tightness or pressure   2. Tachycardia   3. Dehydration     MDM  H/O PE recently restarted on lovenox pw chest tightness, shortness of breath, vomiting. DDx includes PE, PTSD/anxiety, ACS, less likely dissection, pna, ptx. IVF bolus, zofran, morphine, reassess. Discussed risk of contrast induced nephropathy given 2nd CT with contrast today.   1530 Patient continues to c/o tightness, improved from previous. Morphine, reglan ordered.  1711 Patient's HR 88 at this time. States he feels much better.He states that he is unsure if this is PTSD or anxiety but feels different. No recent increased stressors.  Labs reviewed and unremarkable including troponin. Pt's discomfort constant x > 6hrs since awakening this morning, would expect trop to be positive if pain 2/2 ACS.  Ativan ordered but sx resolved prior. Repeat EKG ordered. CT Chest pending.   5:31 PM  CT PE negative for PE, dissection. Repeat EKG unremarkable. Pt feeling better. States that he has no pain at this time. Requesting "something for anxiety". I do not feel that the pt has an acute/emergency cause of his sx at this time. TSH pending. PTSD/anxiety may play a component. Dehydration may play a role. He will f/u with his PMD Dr. Allyne Gee as well as [facility] (has been seen previously). Aware that he should have Cr recheck w/in the week. Given precautions for return.         Forbes Cellar, MD 07/23/11 1733

## 2011-07-24 ENCOUNTER — Telehealth: Payer: Self-pay | Admitting: Nurse Practitioner

## 2011-07-24 LAB — COMPREHENSIVE METABOLIC PANEL
AST: 24 U/L (ref 0–37)
Alkaline Phosphatase: 41 U/L (ref 39–117)
BUN: 11 mg/dL (ref 6–23)
Creatinine, Ser: 1 mg/dL (ref 0.50–1.35)
Glucose, Bld: 84 mg/dL (ref 70–99)
Total Bilirubin: 0.4 mg/dL (ref 0.3–1.2)

## 2011-07-24 LAB — HYPERCOAGULABLE PANEL, COMPREHENSIVE
Anticardiolipin IgA: 1 APL U/mL (ref <22)
Anticardiolipin IgG: 4 GPL U/mL (ref <23)
Anticardiolipin IgM: 1 MPL U/mL (ref <11)
Beta-2 Glyco I IgG: 1 G Units (ref <20)
Beta-2-Glycoprotein I IgM: 1 M Units (ref <20)
DRVVT: 37.1 secs (ref 34.1–42.2)
PTT Lupus Anticoagulant: 34.5 secs (ref 28.0–43.0)
Protein C Activity: 200 % — ABNORMAL HIGH (ref 75–133)
Protein S Activity: 69 % (ref 69–129)
Protein S Total: 100 % (ref 60–150)

## 2011-07-24 NOTE — Telephone Encounter (Signed)
Spoke with patient- requested he call Dr. Haywood Pao office and ask for endoscopy and colonoscopy reports be sent to this office as requested by Dr. Dalene Carrow.  Pt verbalized understanding.

## 2011-07-27 ENCOUNTER — Other Ambulatory Visit: Payer: Self-pay | Admitting: *Deleted

## 2011-07-27 ENCOUNTER — Ambulatory Visit (HOSPITAL_BASED_OUTPATIENT_CLINIC_OR_DEPARTMENT_OTHER): Admitting: Lab

## 2011-07-27 DIAGNOSIS — I749 Embolism and thrombosis of unspecified artery: Secondary | ICD-10-CM

## 2011-07-28 LAB — D-DIMER, QUANTITATIVE: D-Dimer, Quant: 0.36 ug/mL-FEU (ref 0.00–0.48)

## 2011-07-30 ENCOUNTER — Ambulatory Visit: Admitting: Hematology and Oncology

## 2011-07-30 ENCOUNTER — Telehealth: Payer: Self-pay | Admitting: *Deleted

## 2011-07-30 ENCOUNTER — Ambulatory Visit

## 2011-07-30 NOTE — Telephone Encounter (Signed)
Received message from pt re:  Pt unable to come for appt today due to pt was sick all night.   Pt would like to reschedule.    MD notified. Pt's   Phone     [phone].

## 2011-07-31 ENCOUNTER — Ambulatory Visit (HOSPITAL_BASED_OUTPATIENT_CLINIC_OR_DEPARTMENT_OTHER): Admitting: Hematology and Oncology

## 2011-07-31 ENCOUNTER — Telehealth: Payer: Self-pay | Admitting: Hematology and Oncology

## 2011-07-31 ENCOUNTER — Other Ambulatory Visit: Payer: Self-pay

## 2011-07-31 ENCOUNTER — Encounter: Payer: Self-pay | Admitting: Hematology and Oncology

## 2011-07-31 VITALS — BP 137/81 | HR 89 | Temp 97.5°F | Ht 72.0 in | Wt 194.4 lb

## 2011-07-31 DIAGNOSIS — I2699 Other pulmonary embolism without acute cor pulmonale: Secondary | ICD-10-CM

## 2011-07-31 NOTE — Telephone Encounter (Signed)
gv pt appt schedule for sept. Date for 9/19 ok per LO due to 9/16 is a Monday.

## 2011-07-31 NOTE — Telephone Encounter (Signed)
Added pt for f/u today @ 3:15 pm. lmonvm for pt re appt w/my name and direct number.

## 2011-07-31 NOTE — Patient Instructions (Signed)
Patient to follow up as instructed.   Current Outpatient Prescriptions  Medication Sig Dispense Refill  . ALPRAZolam (XANAX) 0.5 MG tablet Take 1 tablet (0.5 mg total) by mouth 3 (three) times daily as needed for sleep.  10 tablet  0  . citalopram (CELEXA) 20 MG tablet Take 20 mg by mouth daily.        Marland Kitchen enoxaparin (LOVENOX) 120 MG/0.8ML SOLN Inject 0.8 mLs (120 mg total) into the skin daily.  30 Syringe  1  . ergocalciferol (VITAMIN D2) 50000 UNITS capsule Take 50,000 Units by mouth 2 (two) times a week. Taken on Tuesdays and Thursdays.      . Multiple Vitamin (MULTIVITAMIN) tablet Take 2 tablets by mouth daily.      Marland Kitchen omeprazole (PRILOSEC) 40 MG capsule Take 40 mg by mouth daily.      . prazosin (MINIPRESS) 2 MG capsule Take 8 mg by mouth at bedtime.      Marland Kitchen QUEtiapine (SEROQUEL) 25 MG tablet Take 25 mg by mouth at bedtime.       . traMADol (ULTRAM) 50 MG tablet Take 50 mg by mouth every 6 (six) hours as needed. Maximum dose= 8 tablets per day; for pain.            March 2013  Sunday Monday Tuesday Wednesday Thursday Friday Saturday                  1   2    3   4   5   6   7   8   9    10   11   12   13    FINANCIAL COUNSELING  10:00 AM  (30 min.)  [facility]-Medon[facility] Artist  [facility]   NEW PATIENT 60  10:30 AM  (60 min.)  Laurice Record, MD  [facility]   LAB ADDON  12:30 PM  (15 min.)  Beverely Pace Shumate  [facility] 14   CT BODY W/  11:30 AM  (30 min.)  Wl-Ct 2  [facility]   Admission   1:50 PM  [facility]EM[facility]  (Discharge: 07/23/2011)   DG PORT   2:40 PM  (15 min.)  Wl-Dg Port 1  [facility]-RADIOLOGY-DIAGNOSTIC   CT ANGIO NECK/C/A/P   3:05 PM  (30 min.)  Wl-Ct Ed  [facility]AG[facility] 15   16    17   18    LAB ADDON   2:00 PM  (15 min.)  Krista Blue  [facility] 19   20   21    EST PT 30  10:30 AM  (30 min.)  [facility]-Hp Covering Provider  [facility] 22   EST PT 30   3:15 PM  (30 min.)  Laurice Record, MD  [facility] 23    24   25   26   27   28   29   30    31                            April 2013  Sunday Monday Tuesday Wednesday Thursday Friday Saturday      1   2   3   4   5    6  7   8   9   10   11    NEW PATIENT  10:20 AM  (20 min.)  Wilmon Arms. Corliss Skains, MD  [facility] 12   13    14   15   16   17   18   19   20    21   22   23   24   25   26   27    28   29    30

## 2011-07-31 NOTE — Progress Notes (Signed)
This office note has been dictated.

## 2011-08-03 NOTE — Progress Notes (Signed)
CC:   Robyn N. Allyne Gee, M.D. Jordan Hawks Elnoria Howard, MD Judithe Modest, MSW, LCSW  IDENTIFYING STATEMENT:  Patient is a 31 year old man who presents to discuss results.  INTERVAL HISTORY:  In summary, the patient was diagnosed with bilateral pulmonary emboli in October of 2011 in [place], [place].  He states that he was on anticoagulation for approximately a year, which was then subsequently discontinued.   Antithrombin III 120%, D-dimer less than 0.22; there was no evidence for factor V Leiden or prothrombin gene mutation; lupus anticoagulant was not detected; protein C activity is 200, protein S activity 69; cardiolipin IgG, IgA and IgM were low; beta 2 glycoprotein IgG, IgA and IgM were also unremarkable. The patient had received a CT angiogram that showed no evidence of pulmonary embolism.  He also received a CT scan of the abdomen and pelvis, which showed no evidence of metastatic disease, except for mild narrowing and colonic wall thickening in the rectosigmoid region.  MEDICATIONS:  Lovenox 120 mg daily.  Rest of the medicines reviewed and updated.  REVIEW OF SYSTEMS:  10-point review of systems is negative.  PHYSICAL EXAMINATION:  General:  Patient is alert and oriented x3. Vitals:  Pulse 89, blood pressure 137/81, temperature 97.5, respirations 20, weight 194 pounds.  HEENT:  Head is atraumatic, normocephalic. Sclerae anicteric.  Mouth moist.  Chest:  Clear.  CVS:  Unremarkable. Abdomen:  Soft, nontender.  Bowel sounds present.  Extremities:  No calf tenderness or edema.  LABORATORY DATA:  Hypercoagulable workup as above.  Additional labs note a white cell count of 4.9, hemoglobin 15.9, hematocrit 48.1, platelets 289.  Sodium 138, potassium 4.5 chloride 101, CO2 27, BUN 11, creatinine 1, glucose 84.  T bili 0.4, alkaline phosphatase 41, AST 24, ALT 34, calcium 10.1.  ASSESSMENT AND PLAN:  [name] is a 31 year old man with a history of bilateral pulmonary emboli  diagnosed in October 2011.   He was placed on a year of anticoagulation but was discontinued in October 2012.  It was resumed fairly recently when he told his primary care physicians that he may have an inherited coagulation abnormality.  He was placed on Lovenox daily.  His hypercoagulable workup is negative.  He would like to discontinue Lovenox.  He was advised that at periods of high risk, I would  Recommend anticoagulation as needed.  Risk factors includes but were not limited to decreased ambulation/mobility, long-haul flights, etc. The patient was also reminded that if he was to have a recurrent thrombosis, anticoagulation would be lifelong.  He is here with his wife, who voiced understanding.  He follows up with me in 6 months' time or as needed.  I have also asked that he follow up with Dr. Allyne Gee.    ______________________________ Laurice Record, M.D. LIO/MEDQ  D:  07/31/2011  T:  07/31/2011  Job:  [id number]

## 2011-08-17 ENCOUNTER — Telehealth: Payer: Self-pay

## 2011-08-17 NOTE — Telephone Encounter (Signed)
DR. Allyne Gee WOULD LIKE DR. Dalene Carrow TO CALL HER TOMORROW 08-18-11 AT [phone] TO DISCUSS PATIENT.

## 2011-08-20 ENCOUNTER — Ambulatory Visit (INDEPENDENT_AMBULATORY_CARE_PROVIDER_SITE_OTHER): Payer: Self-pay | Admitting: Surgery

## 2011-08-28 ENCOUNTER — Encounter (INDEPENDENT_AMBULATORY_CARE_PROVIDER_SITE_OTHER): Payer: Self-pay | Admitting: Surgery

## 2011-09-27 ENCOUNTER — Encounter (HOSPITAL_COMMUNITY): Payer: Self-pay | Admitting: Physical Medicine and Rehabilitation

## 2011-09-27 ENCOUNTER — Emergency Department (HOSPITAL_COMMUNITY)

## 2011-09-27 ENCOUNTER — Inpatient Hospital Stay (HOSPITAL_COMMUNITY)
Admission: EM | Admit: 2011-09-27 | Discharge: 2011-09-30 | DRG: 206 | Disposition: A | Discharge status: Home / Self Care | Source: Ambulatory Visit | Attending: Internal Medicine | Admitting: Internal Medicine

## 2011-09-27 ENCOUNTER — Observation Stay (HOSPITAL_COMMUNITY)

## 2011-09-27 DIAGNOSIS — K921 Melena: Secondary | ICD-10-CM | POA: Diagnosis present

## 2011-09-27 DIAGNOSIS — Z86711 Personal history of pulmonary embolism: Secondary | ICD-10-CM

## 2011-09-27 DIAGNOSIS — E785 Hyperlipidemia, unspecified: Secondary | ICD-10-CM

## 2011-09-27 DIAGNOSIS — D6851 Activated protein C resistance: Secondary | ICD-10-CM

## 2011-09-27 DIAGNOSIS — K219 Gastro-esophageal reflux disease without esophagitis: Secondary | ICD-10-CM | POA: Diagnosis present

## 2011-09-27 DIAGNOSIS — I10 Essential (primary) hypertension: Secondary | ICD-10-CM | POA: Diagnosis present

## 2011-09-27 DIAGNOSIS — I119 Hypertensive heart disease without heart failure: Secondary | ICD-10-CM

## 2011-09-27 DIAGNOSIS — F4310 Post-traumatic stress disorder, unspecified: Secondary | ICD-10-CM | POA: Diagnosis present

## 2011-09-27 DIAGNOSIS — G43909 Migraine, unspecified, not intractable, without status migrainosus: Secondary | ICD-10-CM | POA: Diagnosis present

## 2011-09-27 DIAGNOSIS — R000 Tachycardia, unspecified: Secondary | ICD-10-CM | POA: Diagnosis present

## 2011-09-27 DIAGNOSIS — Z79899 Other long term (current) drug therapy: Secondary | ICD-10-CM

## 2011-09-27 DIAGNOSIS — I749 Embolism and thrombosis of unspecified artery: Secondary | ICD-10-CM

## 2011-09-27 DIAGNOSIS — R079 Chest pain, unspecified: Secondary | ICD-10-CM | POA: Diagnosis present

## 2011-09-27 DIAGNOSIS — D6859 Other primary thrombophilia: Secondary | ICD-10-CM | POA: Diagnosis present

## 2011-09-27 DIAGNOSIS — M940 Chondrocostal junction syndrome [Tietze]: Principal | ICD-10-CM | POA: Diagnosis present

## 2011-09-27 DIAGNOSIS — Z8249 Family history of ischemic heart disease and other diseases of the circulatory system: Secondary | ICD-10-CM

## 2011-09-27 DIAGNOSIS — F339 Major depressive disorder, recurrent, unspecified: Secondary | ICD-10-CM | POA: Diagnosis present

## 2011-09-27 LAB — POCT I-STAT, CHEM 8
BUN: 11 mg/dL (ref 6–23)
Calcium, Ion: 1.26 mmol/L (ref 1.12–1.32)
Chloride: 105 mEq/L (ref 96–112)
Creatinine, Ser: 0.9 mg/dL (ref 0.50–1.35)
Glucose, Bld: 151 mg/dL — ABNORMAL HIGH (ref 70–99)
HCT: 50 % (ref 39.0–52.0)
Hemoglobin: 17 g/dL (ref 13.0–17.0)
Potassium: 3.3 mEq/L — ABNORMAL LOW (ref 3.5–5.1)
Sodium: 143 mEq/L (ref 135–145)
TCO2: 25 mmol/L (ref 0–100)

## 2011-09-27 LAB — CBC
HCT: 46.1 % (ref 39.0–52.0)
Hemoglobin: 16.1 g/dL (ref 13.0–17.0)
MCV: 83.5 fL (ref 78.0–100.0)
RBC: 5.52 MIL/uL (ref 4.22–5.81)
WBC: 7.5 10*3/uL (ref 4.0–10.5)

## 2011-09-27 LAB — POCT I-STAT TROPONIN I: Troponin i, poc: 0 ng/mL (ref 0.00–0.08)

## 2011-09-27 LAB — BASIC METABOLIC PANEL
BUN: 11 mg/dL (ref 6–23)
CO2: 24 mEq/L (ref 19–32)
Chloride: 100 mEq/L (ref 96–112)
GFR calc Af Amer: >90 mL/min (ref >90)
Glucose, Bld: 148 mg/dL — ABNORMAL HIGH (ref 70–99)
Potassium: 3.2 mEq/L — ABNORMAL LOW (ref 3.5–5.1)

## 2011-09-27 LAB — RAPID URINE DRUG SCREEN, HOSP PERFORMED
Barbiturates: NOT DETECTED
Cocaine: NOT DETECTED

## 2011-09-27 MED ORDER — PRAZOSIN HCL 1 MG PO CAPS
8.0000 mg | ORAL_CAPSULE | Freq: Every day | ORAL | Status: DC
  Administered 2011-09-27 – 2011-09-28 (×2): 8 mg via ORAL
  Filled 2011-09-27 (×4): qty 8

## 2011-09-27 MED ORDER — SIMVASTATIN 20 MG PO TABS
20.0000 mg | ORAL_TABLET | Freq: Every day | ORAL | Status: DC
  Administered 2011-09-28 – 2011-09-29 (×2): 20 mg via ORAL
  Filled 2011-09-27 (×3): qty 1

## 2011-09-27 MED ORDER — IOHEXOL 350 MG/ML SOLN
100.0000 mL | Freq: Once | INTRAVENOUS | Status: AC | PRN
  Administered 2011-09-27: 100 mL via INTRAVENOUS

## 2011-09-27 MED ORDER — HYDROMORPHONE HCL PF 1 MG/ML IJ SOLN
1.0000 mg | Freq: Once | INTRAMUSCULAR | Status: AC
  Administered 2011-09-27: 1 mg via INTRAVENOUS
  Filled 2011-09-27: qty 1

## 2011-09-27 MED ORDER — TRAMADOL HCL 50 MG PO TABS
50.0000 mg | ORAL_TABLET | Freq: Four times a day (QID) | ORAL | Status: DC | PRN
  Administered 2011-09-27 – 2011-09-29 (×4): 50 mg via ORAL
  Filled 2011-09-27 (×4): qty 1

## 2011-09-27 MED ORDER — POTASSIUM CHLORIDE IN NACL 20-0.9 MEQ/L-% IV SOLN
INTRAVENOUS | Status: DC
  Administered 2011-09-27 – 2011-09-30 (×6): via INTRAVENOUS
  Filled 2011-09-27 (×10): qty 1000

## 2011-09-27 MED ORDER — ONDANSETRON HCL 4 MG/2ML IJ SOLN
4.0000 mg | Freq: Once | INTRAMUSCULAR | Status: AC
  Administered 2011-09-27: 4 mg via INTRAVENOUS
  Filled 2011-09-27: qty 2

## 2011-09-27 MED ORDER — HYDROCODONE-ACETAMINOPHEN 5-325 MG PO TABS
ORAL_TABLET | ORAL | Status: AC
  Administered 2011-09-27: 1 via ORAL
  Filled 2011-09-27: qty 1

## 2011-09-27 MED ORDER — ACETAMINOPHEN 650 MG RE SUPP: 650.0000 mg | Freq: Four times a day (QID) | RECTAL | Status: DC | PRN

## 2011-09-27 MED ORDER — PANTOPRAZOLE SODIUM 40 MG PO TBEC: 40.0000 mg | DELAYED_RELEASE_TABLET | Freq: Every day | ORAL | Status: DC

## 2011-09-27 MED ORDER — LORAZEPAM 2 MG/ML IJ SOLN
1.0000 mg | Freq: Once | INTRAMUSCULAR | Status: AC
  Administered 2011-09-27: 1 mg via INTRAVENOUS
  Filled 2011-09-27: qty 1

## 2011-09-27 MED ORDER — PRAZOSIN HCL 5 MG PO CAPS
8.0000 mg | ORAL_CAPSULE | Freq: Every day | ORAL | Status: DC
  Filled 2011-09-27: qty 1

## 2011-09-27 MED ORDER — HYDROCODONE-ACETAMINOPHEN 5-325 MG PO TABS
1.0000 | ORAL_TABLET | ORAL | Status: DC | PRN
  Administered 2011-09-27 – 2011-09-28 (×2): 1 via ORAL
  Administered 2011-09-29 – 2011-09-30 (×3): 2 via ORAL
  Filled 2011-09-27 (×2): qty 2
  Filled 2011-09-27: qty 1
  Filled 2011-09-27: qty 2

## 2011-09-27 MED ORDER — SODIUM CHLORIDE 0.9 % IV BOLUS (SEPSIS)
1000.0000 mL | Freq: Once | INTRAVENOUS | Status: AC
  Administered 2011-09-27: 1000 mL via INTRAVENOUS

## 2011-09-27 MED ORDER — RIVAROXABAN 10 MG PO TABS: 10.0000 mg | ORAL_TABLET | Freq: Every day | ORAL | Status: DC

## 2011-09-27 MED ORDER — QUETIAPINE FUMARATE 25 MG PO TABS
25.0000 mg | ORAL_TABLET | Freq: Every day | ORAL | Status: DC
  Administered 2011-09-28 (×2): 25 mg via ORAL
  Filled 2011-09-27 (×3): qty 1

## 2011-09-27 MED ORDER — MORPHINE SULFATE 4 MG/ML IJ SOLN
4.0000 mg | Freq: Once | INTRAMUSCULAR | Status: AC
  Administered 2011-09-27: 4 mg via INTRAVENOUS
  Filled 2011-09-27: qty 1

## 2011-09-27 MED ORDER — NITROGLYCERIN 0.4 MG SL SUBL: 0.4000 mg | SUBLINGUAL_TABLET | SUBLINGUAL | Status: DC | PRN

## 2011-09-27 MED ORDER — ASPIRIN 325 MG PO TABS
325.0000 mg | ORAL_TABLET | Freq: Every day | ORAL | Status: DC
  Administered 2011-09-28: 325 mg via ORAL
  Filled 2011-09-27: qty 1

## 2011-09-27 MED ORDER — ONDANSETRON HCL 4 MG/2ML IJ SOLN
4.0000 mg | Freq: Once | INTRAMUSCULAR | Status: AC
Start: 2011-09-27 — End: 2011-09-27
  Administered 2011-09-27: 4 mg via INTRAVENOUS

## 2011-09-27 MED ORDER — POTASSIUM CHLORIDE CRYS ER 20 MEQ PO TBCR
40.0000 meq | EXTENDED_RELEASE_TABLET | Freq: Once | ORAL | Status: AC
  Administered 2011-09-27: 40 meq via ORAL
  Filled 2011-09-27: qty 2

## 2011-09-27 MED ORDER — ONDANSETRON HCL 4 MG/2ML IJ SOLN
INTRAMUSCULAR | Status: AC
  Filled 2011-09-27: qty 2

## 2011-09-27 MED ORDER — ONDANSETRON HCL 4 MG/2ML IJ SOLN
4.0000 mg | Freq: Four times a day (QID) | INTRAMUSCULAR | Status: DC | PRN
  Administered 2011-09-29 – 2011-09-30 (×2): 4 mg via INTRAVENOUS
  Filled 2011-09-27 (×2): qty 2

## 2011-09-27 MED ORDER — ONDANSETRON HCL 4 MG PO TABS: 4.0000 mg | ORAL_TABLET | Freq: Four times a day (QID) | ORAL | Status: DC | PRN

## 2011-09-27 MED ORDER — SODIUM CHLORIDE 0.9 % IJ SOLN
3.0000 mL | Freq: Two times a day (BID) | INTRAMUSCULAR | Status: DC
  Administered 2011-09-27: 3 mL via INTRAVENOUS

## 2011-09-27 MED ORDER — RIVAROXABAN 10 MG PO TABS
20.0000 mg | ORAL_TABLET | Freq: Every day | ORAL | Status: DC
  Administered 2011-09-28 – 2011-09-30 (×3): 20 mg via ORAL
  Filled 2011-09-27 (×3): qty 2

## 2011-09-27 MED ORDER — ACETAMINOPHEN 325 MG PO TABS
650.0000 mg | ORAL_TABLET | Freq: Four times a day (QID) | ORAL | Status: DC | PRN
  Administered 2011-09-28: 650 mg via ORAL
  Filled 2011-09-27: qty 2

## 2011-09-27 MED ORDER — CITALOPRAM HYDROBROMIDE 20 MG PO TABS
20.0000 mg | ORAL_TABLET | Freq: Every day | ORAL | Status: DC
  Administered 2011-09-28 – 2011-09-29 (×2): 20 mg via ORAL
  Filled 2011-09-27 (×2): qty 1

## 2011-09-27 MED ORDER — RIVAROXABAN 10 MG PO TABS
20.0000 mg | ORAL_TABLET | Freq: Once | ORAL | Status: AC
  Administered 2011-09-27: 20 mg via ORAL
  Filled 2011-09-27: qty 2

## 2011-09-27 MED ORDER — ALPRAZOLAM 0.25 MG PO TABS
0.5000 mg | ORAL_TABLET | Freq: Two times a day (BID) | ORAL | Status: DC | PRN
  Administered 2011-09-28: 0.5 mg via ORAL
  Filled 2011-09-27: qty 1

## 2011-09-27 NOTE — ED Notes (Signed)
Pt presents to department for evaluation of L sided non radiating chest pain. Onset x3 days ago. Also states SOB, diaphoresis and a feeling that his heart is "racing." 8/10 pain at the time, describes as constant pressure sensation. He is conscious alert and oriented x4. No signs of acute distress at the time. Pt states he has issues with anxiety and PTSD and has run out of medication for this.

## 2011-09-27 NOTE — ED Provider Notes (Signed)
Complains of left anterior chest pressure and heart racing onset 3 days ago accompanied by shortness of breath. Treated with Xanax without relief on exam nontoxic alert Glasgow Coma Score 15 heart tachycardic regular rhythm lungs clear auscultation abdomen nondistended nontender. Continues to feel short of breath with chest discomfort after several doses Ativan and morphine administered  Doug Sou, MD 09/28/11 [phone]

## 2011-09-27 NOTE — ED Notes (Signed)
Pt will go to CT before being transferred to floor.

## 2011-09-27 NOTE — ED Provider Notes (Signed)
History     CSN: [id number]  Arrival date & time 09/27/11  1415   First MD Initiated Contact with Patient 09/27/11 1431      Chief Complaint  Patient presents with  . Chest Pain    (Consider location/radiation/quality/duration/timing/severity/associated sxs/prior treatment) The history is provided by the patient.  31 y/o M with PMH HLD, bilateral PE currently compliant with Xarelto therapy, pleural effusion, and PTSD with anxiety presents to ED with c/c of 3 days acute onset left sided chest pain. Pressure, severe, waxing and waning but always present, non-radiating. Assoc with diaphoresis, SOB, tachycardia, and N/V. Denies fever, cough, congestion, abd pain. No identifiable aggravating or alleviating factors. Felt his symptoms may be related to anxiety and has taken 2 xanax without relief. Fam hx sig for early CAD. No prior cardiac workup aside from ED visit in March with no sig findings.  Past Medical History  Diagnosis Date  . Pulmonary embolism   . Pleural effusion   . PTSD (post-traumatic stress disorder)   . Migraines     No past surgical history on file.  History reviewed. No pertinent family history.  History  Substance Use Topics  . Smoking status: Never Smoker   . Smokeless tobacco: Not on file  . Alcohol Use: 1.2 oz/week    2 Glasses of wine per week      Review of Systems 10 systems reviewed and are negative for acute change except as noted in the HPI.  Allergies  Review of patient's allergies indicates no known allergies.  Home Medications   Current Outpatient Rx  Name Route Sig Dispense Refill  . ALPRAZOLAM 0.5 MG PO TABS Oral Take 0.5 mg by mouth 2 (two) times daily as needed. For anxiety    . ATORVASTATIN CALCIUM 20 MG PO TABS Oral Take 20 mg by mouth daily.    Marland Kitchen CITALOPRAM HYDROBROMIDE 20 MG PO TABS Oral Take 20 mg by mouth daily.      Marland Kitchen ENOXAPARIN SODIUM 120 MG/0.8ML [place] SOLN Subcutaneous Inject 0.8 mLs (120 mg total) into the skin daily. 30  Syringe 1    Gave pt original rx at visit today  07/22/11.  Marland Kitchen ERGOCALCIFEROL 50000 UNITS PO CAPS Oral Take 50,000 Units by mouth 2 (two) times a week. Taken on Tuesdays and Thursdays.    Marland Kitchen ONE-DAILY MULTI VITAMINS PO TABS Oral Take 2 tablets by mouth daily.    Marland Kitchen OMEPRAZOLE 40 MG PO CPDR Oral Take 40 mg by mouth daily.    Marland Kitchen PRAZOSIN HCL 2 MG PO CAPS Oral Take 8 mg by mouth at bedtime.    Marland Kitchen QUETIAPINE FUMARATE 25 MG PO TABS Oral Take 25 mg by mouth at bedtime.     Marland Kitchen RIVAROXABAN 10 MG PO TABS Oral Take 10 mg by mouth daily.    . TRAMADOL HCL 50 MG PO TABS Oral Take 50 mg by mouth every 6 (six) hours as needed. Maximum dose= 8 tablets per day; for pain.      BP 151/93  Pulse 139  Temp(Src) 98.4 F (36.9 C) (Oral)  Resp 24  Ht 5\' 11"  (1.803 m)  Wt 196 lb (88.905 kg)  BMI 27.34 kg/m2  SpO2 99%  Physical Exam  Nursing note and vitals reviewed. Constitutional: He is oriented to person, place, and time. He appears well-developed and well-nourished. He appears distressed.  HENT:  Head: Normocephalic and atraumatic.  Right Ear: External ear normal.  Left Ear: External ear normal.  Oral mucosa moist  Eyes: Conjunctivae are normal. Pupils are equal, round, and reactive to light.  Neck: Neck supple.  Cardiovascular: Regular rhythm.  Tachycardia present.   Murmur heard.  Systolic murmur is present  Pulses:      Radial pulses are 2+ on the right side, and 2+ on the left side.  Pulmonary/Chest: Effort normal and breath sounds normal. No respiratory distress. He has no wheezes. He has no rales.  Abdominal: Soft. Bowel sounds are normal. He exhibits no distension. There is no tenderness. There is no guarding.  Musculoskeletal: He exhibits no edema and no tenderness.  Neurological: He is alert and oriented to person, place, and time. Cranial nerve deficit: 3-12 intact.       MS appears baseline for pt and situation  Skin: Skin is warm and dry. He is not diaphoretic.  Psychiatric: His mood  appears anxious.    ED Course  Procedures (including critical care time)  Labs Reviewed  BASIC METABOLIC PANEL - Abnormal; Notable for the following:    Potassium 3.2 (*)    Glucose, Bld 148 (*)    All other components within normal limits  POCT I-STAT, CHEM 8 - Abnormal; Notable for the following:    Potassium 3.3 (*)    Glucose, Bld 151 (*)    All other components within normal limits  CBC  D-DIMER, QUANTITATIVE  POCT I-STAT TROPONIN I   Dg Chest 2 View  09/27/2011  *RADIOLOGY REPORT*  Clinical Data: Left-sided chest pain and shortness of breath.  CHEST - 2 VIEW  Comparison: Chest x-ray 07/23/2011.  Findings: Lung volumes are normal.  No consolidative airspace disease.  No pleural effusions.  No pneumothorax.  No pulmonary nodule or mass noted.  Pulmonary vasculature and the cardiomediastinal silhouette are within normal limits.  IMPRESSION: 1. No radiographic evidence of acute cardiopulmonary disease.  Original Report Authenticated By: Florencia Reasons, M.D.     Date: 09/27/2011  Rate: 143  Rhythm: sinus tachycardia  QRS Axis: normal  Intervals: normal  ST/T Wave abnormalities: normal  Conduction Disutrbances:none  Narrative Interpretation: rate inc from prior, though a separate prior also with sinus tach  Old EKG Reviewed: changes noted       MDM  Recurrent Cp with tachcyardia in pt with hx PE, on anticoagulation. Given reported compliance with anti-coagulation, neg d-dimer x 2 recent tests, non-pleuritic pain, and no recent prolonged trip PE is doubtful. ACS RF include HLD, fam hx. EKG with sinus tachycardia. Neg troponin. Doubt ACS. Labs with mild hypokalemia. CXR unremarkable. After initial tx with morphine, zofran, ativan- HR reduced to 110, systolic BP in 130s, pain improved but still present, murmur greatly reduced.   EDP Jacubowitz and ED resident Maisie Fus have been given report on this patient and will continue to monitor, with goal of continued symptom  improvement.        Shaaron Adler, [place] 09/27/11 [phone]

## 2011-09-27 NOTE — ED Notes (Signed)
Pt returned from CT °

## 2011-09-27 NOTE — ED Notes (Signed)
Pt transported to CT ?

## 2011-09-27 NOTE — ED Provider Notes (Signed)
1630 Assumed pt care from [facility].  Tachycardia, chest pain.  No believed to be 2/2 PE, ACS.  Awaiting symptom control and plan for d/c home.   7:42 PM Persistent chest pain and tachycardia despite fluids, pain meds, anxiolytics.  Etiology unclear.  Hospitalist to admit.  1. Chest pain   2. Tachycardia      Army Chaco, MD 09/27/11 1942

## 2011-09-27 NOTE — ED Notes (Signed)
Pt states that contrast dye gave him a bad h/a. Requesting pain medication.

## 2011-09-27 NOTE — H&P (Signed)
PCP:   Gwynneth Aliment, MD, MD   Chief Complaint:  Chest pains  HPI: This is a pleasant 31 year old gentleman who presents with complaints of chest pains. He states pain has been ongoing on for 3 days, he's had associated palpitation and significant diaphoresis. Chest pains is located on the left side, described as sharp, at its worse 10 out of 10 and occasionally dull. Pain is constant. Today pain radiated up the left jaw and arm. He's had associated shortness of breath, nausea and vomiting. He is also reports dizziness with chest pain, he does not report being presyncopal. He reports a cough productive of white sputum. Pain is worse with movement. He does report some intermittent wheezing and chills. He denies any fever. 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 He has a significant family history of coronary artery disease, the other family member with an MI was at age 18. He does have anxiety and PTSD.   The patient was on Coumadin but was changed to xarelto approximately one month ago. He also has a history of recurrent pleural effusion. History provided by the patient and multiple family members are at the bedside. Patient was recently here with complaints of chest pains. He was diagnosed with anxiety and prescribed Xanax. He states he took  Xanax today however symptoms are not improved. In the ER the patient is quite tachycardic with heart rates as high as 160s. Patient remains tachycardic despite medications and IV fluid hydration.  Review of Systems: positives bolded  anorexia, fever, weight loss,, vision loss, decreased hearing, hoarseness, chest pain, syncope, dyspnea on exertion, peripheral edema, balance deficits, hemoptysis, abdominal pain, melena, hematochezia, severe indigestion/heartburn, hematuria, incontinence, genital sores, muscle weakness, suspicious skin lesions, transient blindness, difficulty walking, depression, unusual weight change, abnormal  bleeding, enlarged lymph nodes, angioedema, and breast masses.  Past Medical History: Past Medical History  Diagnosis Date  . Pulmonary embolism   . Pleural effusion   . PTSD (post-traumatic stress disorder)   . Migraines    No past surgical history on file.  Medications: Prior to Admission medications   Medication Sig Start Date End Date Taking? Authorizing Provider  ALPRAZolam Prudy Feeler) 0.5 MG tablet Take 0.5 mg by mouth 2 (two) times daily as needed. For anxiety   Yes Historical Provider, MD  atorvastatin (LIPITOR) 20 MG tablet Take 20 mg by mouth daily.   Yes Historical Provider, MD  citalopram (CELEXA) 20 MG tablet Take 20 mg by mouth daily.     Yes Historical Provider, MD  enoxaparin (LOVENOX) 120 MG/0.8ML SOLN Inject 0.8 mLs (120 mg total) into the skin daily. 07/22/11  Yes Laurice Record, MD  ergocalciferol (VITAMIN D2) 50000 UNITS capsule Take 50,000 Units by mouth 2 (two) times a week. Taken on Tuesdays and Thursdays.   Yes Historical Provider, MD  Multiple Vitamin (MULTIVITAMIN) tablet Take 2 tablets by mouth daily.   Yes Historical Provider, MD  omeprazole (PRILOSEC) 40 MG capsule Take 40 mg by mouth daily.   Yes Historical Provider, MD  prazosin (MINIPRESS) 2 MG capsule Take 8 mg by mouth at bedtime.   Yes Historical Provider, MD  QUEtiapine (SEROQUEL) 25 MG tablet Take 25 mg by mouth at bedtime.    Yes Historical Provider, MD  rivaroxaban (XARELTO) 10 MG TABS tablet Take 10 mg by mouth daily.   Yes Historical Provider, MD  traMADol (ULTRAM) 50 MG tablet Take 50 mg by mouth every 6 (six) hours as needed. Maximum dose= 8  tablets per day; for pain.   Yes Historical Provider, MD    Allergies:  No Known Allergies  Social History:  reports that he has never smoked. He does not have any smokeless tobacco history on file. He reports that he drinks about 1.2 ounces of alcohol per week. He reports that he does not use illicit drugs.  Family History: Family History  Problem  Relation Age of Onset  . Coronary artery disease      Physical Exam: Filed Vitals:   09/27/11 1930 09/27/11 1945 09/27/11 2000 09/27/11 2015  BP: 142/81 137/81 141/82 129/70  Pulse: 112 119 119 111  Temp:      TempSrc:      Resp: 13 10 15 10   Height:      Weight:      SpO2: 97% 99% 98% 99%    General:  Alert and oriented times three, well developed and nourished, no acute distress Eyes: PERRLA, pink conjunctiva, no scleral icterus ENT: Moist oral mucosa, neck supple, no thyromegaly Lungs: clear to ascultation, no wheeze, no crackles, no use of accessory muscles Cardiovascular: regular rate and rhythm, no regurgitation, no gallops, no murmurs. No carotid bruits, no JVD Abdomen: soft, positive BS, non-tender, non-distended, no organomegaly, not an acute abdomen GU: not examined Neuro: CN II - XII grossly intact, sensation intact Musculoskeletal: strength 5/5 all extremities, no clubbing, cyanosis or edema, very reproducible chest wall pain Skin: no rash, no subcutaneous crepitation, no decubitus Psych: appropriate patient   Labs on Admission:   [facility] 09/27/11 1516 09/27/11 1502  NA 143 138  K 3.3* 3.2*  CL 105 100  CO2 -- 24  GLUCOSE 151* 148*  BUN 11 11  CREATININE 0.90 0.82  CALCIUM -- 9.8  MG -- --  PHOS -- --  Results for SEHAJ, MCENROE (MRN [id number]) as of 09/27/2011 20:31  Ref. Range 09/27/2011 15:26  Troponin i, poc Latest Range: 0.00-0.08 ng/mL 0.00   No results found for this basename: AST:2,ALT:2,ALKPHOS:2,BILITOT:2,PROT:2,ALBUMIN:2 in the last 72 hours No results found for this basename: LIPASE:2,AMYLASE:2 in the last 72 hours  Basename 09/27/11 1516 09/27/11 1502  WBC -- 7.5  NEUTROABS -- --  HGB 17.0 16.1  HCT 50.0 46.1  MCV -- 83.5  PLT -- 255   No results found for this basename: CKTOTAL:3,CKMB:3,CKMBINDEX:3,TROPONINI:3 in the last 72 hours No components found with this basename: POCBNP:3  Basename 09/27/11 1502  DDIMER 0.36   No  results found for this basename: HGBA1C:2 in the last 72 hours No results found for this basename: CHOL:2,HDL:2,LDLCALC:2,TRIG:2,CHOLHDL:2,LDLDIRECT:2 in the last 72 hours No results found for this basename: TSH,T4TOTAL,FREET3,T3FREE,THYROIDAB in the last 72 hours No results found for this basename: VITAMINB12:2,FOLATE:2,FERRITIN:2,TIBC:2,IRON:2,RETICCTPCT:2 in the last 72 hours  Micro Results: No results found for this or any previous visit (from the past 240 hour(s)).   Radiological Exams on Admission: Dg Chest 2 View  09/27/2011  *RADIOLOGY REPORT*  Clinical Data: Left-sided chest pain and shortness of breath.  CHEST - 2 VIEW  Comparison: Chest x-ray 07/23/2011.  Findings: Lung volumes are normal.  No consolidative airspace disease.  No pleural effusions.  No pneumothorax.  No pulmonary nodule or mass noted.  Pulmonary vasculature and the cardiomediastinal silhouette are within normal limits.  IMPRESSION: 1. No radiographic evidence of acute cardiopulmonary disease.  Original Report Authenticated By: Florencia Reasons, M.D.     EKG: Normal, tachycardia  Assessment/Plan Present on Admission:  .Chest pain Tachycardia Admit to observation status on telemetry Patient with very reproducible  chest pain consistent with costochondritis. However, tachycardia remains unexplained. I will admit to telemetry monitor, cycle cardiac enzymes. Aspirin daily, lipid panel in the a.m. Since patient is on xarelto, which is new for approximately month, I were to rule out a recurrent PE with CT scan. Pain medications when necessary The patient also have a combination of costochondritis as well as a viral syndrome which is causing his nausea and vomiting, dizziness and tachycardia. IV fluids ordered an antiemetics. History of PE Factor V deficiency Hypertension Hyperlipidemia .Post traumatic stress disorder (PTSD) resume home medications  Full code DVT prophylaxis not needed Team 2/Dr.  Art Buff, Aliscia Clayton 09/27/2011, 8:31 PM

## 2011-09-27 NOTE — ED Notes (Signed)
Care transferred and report given to Julie, RN 

## 2011-09-28 DIAGNOSIS — I10 Essential (primary) hypertension: Secondary | ICD-10-CM

## 2011-09-28 DIAGNOSIS — R079 Chest pain, unspecified: Secondary | ICD-10-CM

## 2011-09-28 DIAGNOSIS — Z86711 Personal history of pulmonary embolism: Secondary | ICD-10-CM

## 2011-09-28 DIAGNOSIS — E785 Hyperlipidemia, unspecified: Secondary | ICD-10-CM

## 2011-09-28 LAB — LIPID PANEL
HDL: 38 mg/dL — ABNORMAL LOW (ref >39)
Total CHOL/HDL Ratio: 5.5 RATIO
VLDL: 29 mg/dL (ref 0–40)

## 2011-09-28 LAB — TROPONIN I
Troponin I: <0.3 ng/mL (ref <0.30)
Troponin I: <0.3 ng/mL (ref <0.30)
Troponin I: <0.3 ng/mL (ref <0.30)

## 2011-09-28 LAB — BASIC METABOLIC PANEL
BUN: 8 mg/dL (ref 6–23)
Calcium: 9 mg/dL (ref 8.4–10.5)
GFR calc Af Amer: >90 mL/min (ref >90)
GFR calc non Af Amer: >90 mL/min (ref >90)
Glucose, Bld: 98 mg/dL (ref 70–99)
Potassium: 4 mEq/L (ref 3.5–5.1)
Sodium: 140 mEq/L (ref 135–145)

## 2011-09-28 LAB — TSH: TSH: 0.833 u[IU]/mL (ref 0.350–4.500)

## 2011-09-28 LAB — CBC
MCH: 29.3 pg (ref 26.0–34.0)
MCHC: 34.4 g/dL (ref 30.0–36.0)
Platelets: 245 10*3/uL (ref 150–400)
RBC: 4.88 MIL/uL (ref 4.22–5.81)

## 2011-09-28 LAB — T4, FREE: Free T4: 1.1 ng/dL (ref 0.80–1.80)

## 2011-09-28 MED ORDER — PANTOPRAZOLE SODIUM 40 MG PO TBEC
40.0000 mg | DELAYED_RELEASE_TABLET | Freq: Two times a day (BID) | ORAL | Status: DC
  Administered 2011-09-28 – 2011-09-30 (×6): 40 mg via ORAL
  Filled 2011-09-28 (×6): qty 1

## 2011-09-28 NOTE — Progress Notes (Signed)
Utilization review complete 

## 2011-09-28 NOTE — ED Provider Notes (Signed)
Medical screening examination/treatment/procedure(s) were performed by non-physician practitioner and as supervising physician I was immediately available for consultation/collaboration.   Gavin Pound. Oletta Lamas, MD 09/28/11 [phone]

## 2011-09-28 NOTE — ED Provider Notes (Signed)
Medical screening examination/treatment/procedure(s) were performed by non-physician practitioner and as supervising physician I was immediately available for consultation/collaboration.   Natthew Marlatt Y. Eldene Plocher, MD 09/28/11 0830 

## 2011-09-28 NOTE — Progress Notes (Signed)
  Echocardiogram 2D Echocardiogram has been performed.  Harold Cohen 09/28/2011, 11:28 AM

## 2011-09-28 NOTE — Progress Notes (Signed)
Subjective: Patient relates chest pain, reproduce by palpation.  Relates some chest pain with movement.  Vomit 4 times yesterday. Feels can try clear diet.  Objective: Filed Vitals:   09/27/11 2100 09/27/11 2115 09/27/11 2200 09/28/11 0500  BP: 131/76 122/63 161/98 119/64  Pulse: 108 94 96 80  Temp:   98.2 F (36.8 C) 98.2 F (36.8 C)  TempSrc:   Oral Oral  Resp: 15 13 16 16   Height:   5\' 11"  (1.803 m)   Weight:   88.905 kg (196 lb)   SpO2: 100% 99% 100% 98%   Weight change:    General: Alert, awake, oriented x3, in no acute distress.  HEENT: No bruits, no goiter.  Heart: Regular rate and rhythm, without murmurs, rubs, gallops.  Lungs: CTA bilateral air movement.  Abdomen: Soft, nontender, nondistended, positive bowel sounds.  Neuro: Grossly intact, nonfocal. Extremities; no edema.   Lab Results:  Basename 09/28/11 0700 09/27/11 1516 09/27/11 1502  NA 140 143 --  K 4.0 3.3* --  CL 106 105 --  CO2 23 -- 24  GLUCOSE 98 151* --  BUN 8 11 --  CREATININE 0.89 0.90 --  CALCIUM 9.0 -- 9.8  MG -- -- --  PHOS -- -- --    Basename 09/28/11 0700 09/27/11 1516 09/27/11 1502  WBC 7.4 -- 7.5  NEUTROABS -- -- --  HGB 14.3 17.0 --  HCT 41.6 50.0 --  MCV 85.2 -- 83.5  PLT 245 -- 255    Basename 09/28/11 0643 09/27/11 2309  CKTOTAL -- --  CKMB -- --  CKMBINDEX -- --  TROPONINI <0.30 <0.30   No components found with this basename: POCBNP:3  Basename 09/27/11 1502  DDIMER 0.36   No results found for this basename: HGBA1C:2 in the last 72 hours  Basename 09/28/11 0700  CHOL 209*  HDL 38*  LDLCALC 142*  TRIG 146  CHOLHDL 5.5  LDLDIRECT --    Basename 09/27/11 1747  TSH 0.833  T4TOTAL --  T3FREE --  THYROIDAB --   No results found for this basename: VITAMINB12:2,FOLATE:2,FERRITIN:2,TIBC:2,IRON:2,RETICCTPCT:2 in the last 72 hours  Micro Results: No results found for this or any previous visit (from the past 240 hour(s)).  Studies/Results: Dg Chest 2  View  09/27/2011  *RADIOLOGY REPORT*  Clinical Data: Left-sided chest pain and shortness of breath.  CHEST - 2 VIEW  Comparison: Chest x-ray 07/23/2011.  Findings: Lung volumes are normal.  No consolidative airspace disease.  No pleural effusions.  No pneumothorax.  No pulmonary nodule or mass noted.  Pulmonary vasculature and the cardiomediastinal silhouette are within normal limits.  IMPRESSION: 1. No radiographic evidence of acute cardiopulmonary disease.  Original Report Authenticated By: Florencia Reasons, M.D.   Ct Angio Chest W/cm &/or Wo Cm  09/27/2011  *RADIOLOGY REPORT*  Clinical Data: Chest pain, shortness of breath.  CT ANGIOGRAPHY CHEST  Technique:  Multidetector CT imaging of the chest using the standard protocol during bolus administration of intravenous contrast. Multiplanar reconstructed images including MIPs were obtained and reviewed to evaluate the vascular anatomy.  Contrast: OMNIPAQUE IOHEXOL 350 MG/ML SOLN  Comparison: 09/27/2011 radiograph, 07/23/2011 CT  Findings: No pulmonary arterial branch filling defect.  Heart size within normal limits.  No significant pleural or pericardial effusion.  No intrathoracic lymphadenopathy.  Central airways are patent.  Detailed parenchymal evaluation is degraded by respiratory motion.  Mild bibasilar mosaic attenuation may reflect atelectasis and/or air trapping.  More focal linear opacities likely represent scarring or atelectasis.  No pneumothorax.  Limited images through the upper abdomen demonstrate low attenuation of the liver, suggesting fatty infiltration.  No acute osseous finding.  IMPRESSION: No pulmonary embolism identified.  Minimal dependent atelectasis and/or air trapping.  Otherwise, no acute intrathoracic process identified.  Original Report Authenticated By: Waneta Martins, M.D.    Medications: I have reviewed the patient's current medications.  1-Chest pain: atypical in 31 year old. ? Family history significant.  Troponin times 3 negative. Probably costochondritis, ? Gastritis. I will increase PPI. Pain medication. CT negative for PE. I will order ECHO.  2-Sinus tachycardia: In setting of dehydration. IV fluids. TSH normal. ECHO. 3-Hypertension:  BP stable.  4-Hyperlipidemia: continue with Lipitor.   Disposition: Start diet, advance as tolerated. Monitor on telemetry. ECHO.     LOS: 1 day   Everlena Mackley M.D.  [facility] 09/28/2011, 8:57 AM

## 2011-09-28 NOTE — Clinical Social Work Psychosocial (Signed)
     Clinical Social Work Department BRIEF PSYCHOSOCIAL ASSESSMENT 09/28/2011  Patient:  Harold Cohen,Harold Cohen     Account Number:  [id number]     Admit date:  09/27/2011  Clinical Social Worker:  Doree Albee  Date/Time:  09/28/2011 04:04 PM  Referred by:  RN  Date Referred:  09/28/2011 Referred for  Other - See comment   Other Referral:   psych medication assistance   Interview type:  Patient Other interview type:    PSYCHOSOCIAL DATA Living Status:  FAMILY Admitted from facility:   Level of care:   Primary support name:  Allen-Picado,Shameka Primary support relationship to patient:  SPOUSE Degree of support available:   strong    CURRENT CONCERNS Current Concerns  Other - See comment   Other Concerns:   psych outpatient follow up    SOCIAL WORK ASSESSMENT / PLAN CSW met with pt at bedside to discuss pt current csw needs and to assess further csw needs. Pt stated that he was diagnosed with PTSD and the medication has been helpful but he is looking for an outpatient psychiatritist to follow and help manage his medications. Pt shared that his PCP had continued to referr patient to LCSW and not a psychiatrist. Pt states that he needs more medicaiton possible.    Pt denied any suicidal ideation or homicidal ideation. Pt stated that his heart just races and he has been thirsty all day. Pt stated that pt spouse had gotten in touch with pt pcp and has recieved a list of providers to help establish patient for outpatient psychiatry.    CSW and Pt discussed patient strengths of a strong family including his spouse and pt dad who was present with pt permission for assessment.    Pt also stated that a strength for him is knowing how to ask for help and identifying when pt medications are no longer working. Pt stated that he is motivated to follow up with an outpatient psychiatrist and believe pt and pt spouse will be able to find the right one. CSW also provided pt with a  list of outpatient psychiatrtist and [facility].    Pt agreed to contact csw if he had further questions or concerns.   Assessment/plan status:  No Further Intervention Required Other assessment/ plan:   and dc planning   Information/referral to community resources:   outpatient psychiatirtist, [facility] fa[facility].    PATIENTS/FAMILYS RESPONSE TO PLAN OF CARE: Pt appreciative of csw concern and support. Pt plans to follow up with out patient psychiatrist from list provided to pt spouse from the pcp, as well as the csw.

## 2011-09-29 DIAGNOSIS — F339 Major depressive disorder, recurrent, unspecified: Secondary | ICD-10-CM

## 2011-09-29 DIAGNOSIS — F4310 Post-traumatic stress disorder, unspecified: Secondary | ICD-10-CM

## 2011-09-29 DIAGNOSIS — I10 Essential (primary) hypertension: Secondary | ICD-10-CM

## 2011-09-29 DIAGNOSIS — Z86711 Personal history of pulmonary embolism: Secondary | ICD-10-CM

## 2011-09-29 DIAGNOSIS — R079 Chest pain, unspecified: Secondary | ICD-10-CM

## 2011-09-29 DIAGNOSIS — E785 Hyperlipidemia, unspecified: Secondary | ICD-10-CM

## 2011-09-29 LAB — CBC
MCV: 85.1 fL (ref 78.0–100.0)
Platelets: 242 10*3/uL (ref 150–400)
RBC: 4.89 MIL/uL (ref 4.22–5.81)
WBC: 6.1 10*3/uL (ref 4.0–10.5)

## 2011-09-29 LAB — TROPONIN I: Troponin I: <0.3 ng/mL (ref <0.30)

## 2011-09-29 MED ORDER — MORPHINE SULFATE 2 MG/ML IJ SOLN
1.0000 mg | Freq: Once | INTRAMUSCULAR | Status: AC
  Administered 2011-09-29: 1 mg via INTRAVENOUS
  Filled 2011-09-29: qty 1

## 2011-09-29 MED ORDER — QUETIAPINE FUMARATE 50 MG PO TABS
50.0000 mg | ORAL_TABLET | Freq: Every day | ORAL | Status: DC
  Filled 2011-09-29 (×2): qty 1

## 2011-09-29 MED ORDER — PROMETHAZINE HCL 25 MG/ML IJ SOLN
12.5000 mg | Freq: Four times a day (QID) | INTRAMUSCULAR | Status: DC | PRN
  Administered 2011-09-29 – 2011-09-30 (×2): 12.5 mg via INTRAVENOUS
  Filled 2011-09-29 (×2): qty 1

## 2011-09-29 MED ORDER — CITALOPRAM HYDROBROMIDE 40 MG PO TABS
40.0000 mg | ORAL_TABLET | Freq: Every day | ORAL | Status: DC
  Administered 2011-09-30: 40 mg via ORAL
  Filled 2011-09-29: qty 1

## 2011-09-29 NOTE — Progress Notes (Signed)
Pt experienced episode of nausea HR increased to 140-170's. Pt states "I was coughing and chocking". Pt is not due to get Zofran yet, its to early.  MD paged and notified.
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
## 2011-09-29 NOTE — Progress Notes (Signed)
Pt did have one small amount of emesis. Emesis had small amt of blood spots/dime size, otherwise clear in color.  Pt reports poor po intake for the past few days but has been drinking fluids.

## 2011-09-29 NOTE — Progress Notes (Signed)
Subjective: Patient complaining this morning of headaches. He gets headache once in a while. He had 5 episode of watery stool. Last 2 episode with blood. He has history of hemorrhoid.  He vomit once last night. He has some upset stomach.  He is still complaining of some chest soreness.  He denies suicidal thought.   Objective: Filed Vitals:   09/28/11 0500 09/28/11 1400 09/28/11 2100 09/29/11 0500  BP: 119/64 132/80 136/77 122/66  Pulse: 80 91 74 72  Temp: 98.2 F (36.8 C) 98.2 F (36.8 C) 98.1 F (36.7 C) 97.5 F (36.4 C)  TempSrc: Oral Oral Oral Oral  Resp: 16 16 18 18   Height:      Weight:      SpO2: 98% 99% 99% 99%   Weight change:    General: Alert, awake, oriented x3, in no acute distress.  HEENT: No bruits, no goiter.  Heart: Regular rate and rhythm, without murmurs, rubs, gallops.  Lungs: CTA.  Abdomen: Soft, nontender, nondistended, positive bowel sounds.  Neuro: Grossly intact, nonfocal. Extremities; No edema.    Lab Results:  Basename 09/28/11 0700 09/27/11 1516 09/27/11 1502  NA 140 143 --  K 4.0 3.3* --  CL 106 105 --  CO2 23 -- 24  GLUCOSE 98 151* --  BUN 8 11 --  CREATININE 0.89 0.90 --  CALCIUM 9.0 -- 9.8  MG -- -- --  PHOS -- -- --    Basename 09/29/11 0630 09/28/11 0700  WBC 6.1 7.4  NEUTROABS -- --  HGB 14.3 14.3  HCT 41.6 41.6  MCV 85.1 85.2  PLT 242 245    Basename 09/29/11 0630 09/28/11 2132 09/28/11 1507  CKTOTAL -- -- --  CKMB -- -- --  CKMBINDEX -- -- --  TROPONINI <0.30 <0.30 <0.30    Basename 09/27/11 1502  DDIMER 0.36    Basename 09/28/11 0700  CHOL 209*  HDL 38*  LDLCALC 142*  TRIG 146  CHOLHDL 5.5  LDLDIRECT --    Basename 09/27/11 1747  TSH 0.833  T4TOTAL --  T3FREE --  THYROIDAB --    Studies/Results: Dg Chest 2 View  09/27/2011  *RADIOLOGY REPORT*  Clinical Data: Left-sided chest pain and shortness of breath.  CHEST - 2 VIEW  Comparison: Chest x-ray 07/23/2011.  Findings: Lung volumes are normal.   No consolidative airspace disease.  No pleural effusions.  No pneumothorax.  No pulmonary nodule or mass noted.  Pulmonary vasculature and the cardiomediastinal silhouette are within normal limits.  IMPRESSION: 1. No radiographic evidence of acute cardiopulmonary disease.  Original Report Authenticated By: Florencia Reasons, M.D.   Ct Angio Chest W/cm &/or Wo Cm  09/27/2011  *RADIOLOGY REPORT*  Clinical Data: Chest pain, shortness of breath.  CT ANGIOGRAPHY CHEST  Technique:  Multidetector CT imaging of the chest using the standard protocol during bolus administration of intravenous contrast. Multiplanar reconstructed images including MIPs were obtained and reviewed to evaluate the vascular anatomy.  Contrast: OMNIPAQUE IOHEXOL 350 MG/ML SOLN  Comparison: 09/27/2011 radiograph, 07/23/2011 CT  Findings: No pulmonary arterial branch filling defect.  Heart size within normal limits.  No significant pleural or pericardial effusion.  No intrathoracic lymphadenopathy.  Central airways are patent.  Detailed parenchymal evaluation is degraded by respiratory motion.  Mild bibasilar mosaic attenuation may reflect atelectasis and/or air trapping.  More focal linear opacities likely represent scarring or atelectasis.  No pneumothorax.  Limited images through the upper abdomen demonstrate low attenuation of the liver, suggesting fatty infiltration.  No acute osseous finding.  IMPRESSION: No pulmonary embolism identified.  Minimal dependent atelectasis and/or air trapping.  Otherwise, no acute intrathoracic process identified.  Original Report Authenticated By: Waneta Martins, M.D.    Medications: I have reviewed the patient's current medications.  1-Chest pain: atypical in 31 year old. ? Family history significant. Troponin times 3 negative. Probably costochondritis, ? Gastritis.  increase PPI to BID. CT negative for PE.  ECHO : Left ventricle: The cavity size was normal. There was mild concentric hypertrophy.  Systolic function was hyperdynamic. The estimated ejection fraction was in the range of 70% to 75%. There was no dynamic obstruction. Wall motion was normal; there were no regional wall motion abnormalities. Mitral valve: There was systolic anterior motion of the chordal structures.  2-Sinus tachycardia: Probably related to dehydration. TSH normal. ECHO showed mild concentric hypertrophy. Patient relates 5 episode of diarrhea yesterday and vomiting. I will ask staff to document diarrhea and  vomiting. HR in the 130 last night and  earlier morning. Continue to monitor on telemetry. I will continue with IV fluids. Patient with tachycardia, headaches, metanephrine urine ordered.    3-Hypertension: BP stable.   4-Hyperlipidemia: continue with Lipitor.   5-Diarrhea/ vomiting: ? Gastroenteritis. IV fluids.  6-Blood stool: History of hemorrhoids. Hb stable at 14 from yesterday. Patient on anticoagulation for history of PE.  7-History of PE: Xarelto.  8-PTSD: Continue with current medications. Patient denies suicidal thought. Patient feeling depress. Will ask Psych to evaluate and help with medications.   Disposition:  Advance diet as tolerated.  Monitor on telemetry.  HIV.        LOS: 2 days   Monnie Gudgel M.D.  [facility] 09/29/2011, 9:31 AM

## 2011-09-29 NOTE — Care Management Note (Signed)
    Page 1 of 1   09/29/2011     2:35:01 PM   CARE MANAGEMENT NOTE 09/29/2011  Patient:  Pressly,Jahmai   Account Number:  [id number]  Date Initiated:  09/29/2011  Documentation initiated by:  GRAVES-BIGELOW,Kajah Santizo  Subjective/Objective Assessment:   Pt admitted with cp. Has some issues with his psych meds. MD did plan for psych to consult before d/c in order for medicaitons to be reevaluated.     Action/Plan:   CM did speak to pt and CSW did provide pt with resources. No further needs from CM at this time.   Anticipated DC Date:  10/01/2011   Anticipated DC Plan:  HOME/SELF CARE  In-house referral  Clinical Social Worker      DC Planning Services  CM consult      Choice offered to / List presented to:             Status of service:  Completed, signed off Medicare Important Message given?   (If response is "NO", the following Medicare IM given date fields will be blank) Date Medicare IM given:   Date Additional Medicare IM given:    Discharge Disposition:  HOME/SELF CARE  Per UR Regulation:  Reviewed for med. necessity/level of care/duration of stay  If discussed at Long Length of Stay Meetings, dates discussed:    Comments:

## 2011-09-30 ENCOUNTER — Inpatient Hospital Stay (HOSPITAL_COMMUNITY)

## 2011-09-30 DIAGNOSIS — I10 Essential (primary) hypertension: Secondary | ICD-10-CM

## 2011-09-30 DIAGNOSIS — R079 Chest pain, unspecified: Secondary | ICD-10-CM

## 2011-09-30 DIAGNOSIS — R112 Nausea with vomiting, unspecified: Secondary | ICD-10-CM

## 2011-09-30 DIAGNOSIS — Z86711 Personal history of pulmonary embolism: Secondary | ICD-10-CM

## 2011-09-30 LAB — CBC
Platelets: 236 10*3/uL (ref 150–400)
RBC: 4.87 MIL/uL (ref 4.22–5.81)
WBC: 4.7 10*3/uL (ref 4.0–10.5)

## 2011-09-30 LAB — BASIC METABOLIC PANEL
CO2: 24 mEq/L (ref 19–32)
Chloride: 108 mEq/L (ref 96–112)
Potassium: 4 mEq/L (ref 3.5–5.1)
Sodium: 141 mEq/L (ref 135–145)

## 2011-09-30 LAB — HIV ANTIBODY (ROUTINE TESTING W REFLEX): HIV: NONREACTIVE

## 2011-09-30 MED ORDER — IOHEXOL 300 MG/ML  SOLN
80.0000 mL | Freq: Once | INTRAMUSCULAR | Status: AC | PRN
  Administered 2011-09-30: 80 mL via INTRAVENOUS

## 2011-09-30 MED ORDER — CITALOPRAM HYDROBROMIDE 20 MG PO TABS: 40.0000 mg | ORAL_TABLET | Freq: Every day | ORAL | Status: DC

## 2011-09-30 MED ORDER — IOHEXOL 300 MG/ML  SOLN
20.0000 mL | INTRAMUSCULAR | Status: AC
  Administered 2011-09-30 (×2): 20 mL via ORAL

## 2011-09-30 MED ORDER — QUETIAPINE FUMARATE 25 MG PO TABS: 50.0000 mg | ORAL_TABLET | Freq: Every day | ORAL | Status: AC

## 2011-09-30 MED ORDER — IBUPROFEN 800 MG PO TABS
800.0000 mg | ORAL_TABLET | Freq: Once | ORAL | Status: AC
  Administered 2011-09-30: 800 mg via ORAL
  Filled 2011-09-30: qty 1

## 2011-09-30 NOTE — Consult Note (Addendum)
Clinical Social Work Progress Note PSYCHIATRY SERVICE LINE 09/30/2011  Patient:  Harold Cohen  Account:  [id number]  Admit Date:  09/27/2011  Clinical Social Worker:  Ashley Jacobs, LCSW  Date/Time:  09/30/2011 10:19 AM  Review of Patient  Overall Medical Condition:   Received consult for medication management and outpatient referral.  Patient medically improved and able to discuss needs and referrals.  Patient reports his mental health medication for anxiety, sleep, and PTSD was not helpful and he needed someone to management and outpatient information.    Patient agreeable to begin treatment at [facility] and also follow up at the [facility] as well.  Patient reports he is interested in establishing care with IOP at [facility].  Will make referral.    Patient was very cooperative and alert/oriented. Patient wife was in room and appreciative of visit but no needs were voices.  Patient was very somber, but pleasant.  His mood appeared depressed and flat, but reports he is doing better and got a lot of sleep last.   Participation Level:  Active  Participation Quality  Appropriate  Supportive   Other Participation Quality:   Affect  Flat   Cognitive  Oriented  Alert   Reaction to Medications/Concerns:   Patient reports he is glad that medications were adjusted. Noticed he was more rested last night and medications are helping   Modes of Intervention  Support  Education   Summary of Progress/Plan at Discharge   Patient will be referred to [facility] per his request and also IOP to establish care in [facility] for Psych needs and medications.    He also wants to work with [place] and use those benefits as well.  His plan is to return home with wife at dc and no safety concerns at this time. patient does not voice SI, HI, or psychosis.  Patient will be given mobile crisis line in case needs arise.    Will sign off at this time and no needs.  Please call if needs arise.   Ashley Jacobs, MSW  LCSW [phone]   Patient was given all outpatient follow up information and Mobile Crisis information for his records and review.

## 2011-09-30 NOTE — Discharge Summary (Signed)
DISCHARGE SUMMARY  Auther Lyerly  MR#: [id number]  DOB:05/19/1980  Date of Admission: 09/27/2011 Date of Discharge: 09/30/2011  Attending Physician:Jones Viviani  Patient's WUJ:WJXBJYN,WGNFA N, MD, MD  Consults:Treatment Team:  Nehemiah Settle, MD  Discharge Diagnoses: Present on Admission:  .Chest pain likely due to costochondritis. Marland KitchenPost traumatic stress disorder (PTSD) Pulmonary embolism Factor V Leiden Mutation GERD.  Hospital Course: 31 year old male admitted with chest pain, likely due to costochondritis. No evidence to suggest cardiopulmonary origin. Ct angio chest negative for PE. Patient also had diarrhea and vomiting, prompting ct abdomen/pelvis, which was unrevealing. He was seen by psychiatry as PTSD seemed to play significant role in current presentation. Dr Elsie Saas increased seroquel/celexa doses as at d/c. Mr Heidelberger feels better this afternoon and will be discharged home to follow Psych at the [place].  Medication List  As of 09/30/2011  6:43 PM   TAKE these medications         ALPRAZolam 0.5 MG tablet   Commonly known as: XANAX   Take 0.5 mg by mouth 2 (two) times daily as needed. For anxiety      atorvastatin 20 MG tablet   Commonly known as: LIPITOR   Take 20 mg by mouth daily.      citalopram 20 MG tablet   Commonly known as: CELEXA   Take 2 tablets (40 mg total) by mouth daily.      enoxaparin 120 MG/0.8ML injection   Commonly known as: LOVENOX   Inject 0.8 mLs (120 mg total) into the skin daily.      ergocalciferol 50000 UNITS capsule   Commonly known as: VITAMIN D2   Take 50,000 Units by mouth 2 (two) times a week. Taken on Tuesdays and Thursdays.      multivitamin tablet   Take 2 tablets by mouth daily.      omeprazole 40 MG capsule   Commonly known as: PRILOSEC   Take 40 mg by mouth daily.      prazosin 2 MG capsule   Commonly known as: MINIPRESS   Take 8 mg by mouth at bedtime.      QUEtiapine 25 MG tablet   Commonly  known as: SEROQUEL   Take 2 tablets (50 mg total) by mouth at bedtime.      rivaroxaban 10 MG Tabs tablet   Commonly known as: XARELTO   Take 10 mg by mouth daily.      traMADol 50 MG tablet   Commonly known as: ULTRAM   Take 50 mg by mouth every 6 (six) hours as needed. Maximum dose= 8 tablets per day; for pain.             Day of Discharge BP 125/66  Pulse 85  Temp(Src) 97.4 F (36.3 C) (Oral)  Resp 18  Ht 5\' 11"  (1.803 m)  Wt 88.905 kg (196 lb)  BMI 27.34 kg/m2  SpO2 99%  Physical Exam: Normal.  Results for orders placed during the hospital encounter of 09/27/11 (from the past 24 hour(s))  CBC     Status: Normal   Collection Time   09/30/11  7:12 AM      Component Value Range   WBC 4.7  4.0 - 10.5 (K/uL)   RBC 4.87  4.22 - 5.81 (MIL/uL)   Hemoglobin 14.2  13.0 - 17.0 (g/dL)   HCT 21.3  08.6 - 57.8 (%)   MCV 84.0  78.0 - 100.0 (fL)   MCH 29.2  26.0 - 34.0 (pg)   MCHC 34.7  30.0 - 36.0 (g/dL)   RDW 16.1  09.6 - 04.5 (%)   Platelets 236  150 - 400 (K/uL)  BASIC METABOLIC PANEL     Status: Abnormal   Collection Time   09/30/11  7:12 AM      Component Value Range   Sodium 141  135 - 145 (mEq/L)   Potassium 4.0  3.5 - 5.1 (mEq/L)   Chloride 108  96 - 112 (mEq/L)   CO2 24  19 - 32 (mEq/L)   Glucose, Bld 92  70 - 99 (mg/dL)   BUN <3 (*) 6 - 23 (mg/dL)   Creatinine, Ser 4.09  0.50 - 1.35 (mg/dL)   Calcium 9.1  8.4 - 81.1 (mg/dL)   GFR calc non Af Amer >90  >90 (mL/min)   GFR calc Af Amer >90  >90 (mL/min)    Disposition: home today.   Follow-up Appts: Discharge Orders    Future Appointments: Provider: Department: Dept Phone: Center:   01/28/2012 11:00 AM Delcie Roch [facility] [phone] None   01/28/2012 11:30 AM Laurice Record, MD [facility] [phone] None     Future Orders Please Complete By Expires   Diet - low sodium heart healthy      Increase activity slowly           Time spent in discharge (includes decision making &  examination of pt): 35 minutes  Signed: Maciej Schweitzer 09/30/2011, 6:43 PM

## 2011-09-30 NOTE — Progress Notes (Signed)
Treyten Monestime is a 31 y.o. male [place] veteran admitted with chest pains and palpitations. Work up so far unremarkable. He now complaints of diarrhea, vomiting with some tinge of blood. He seems depressed. I have reviewed his chart, seen and examined him at bed side in the presence of his wife. Appreciate psych input.   SUBJECTIVE He complaints of soreness in the chest.   1. Chest pain   2. Tachycardia   3. Embolism     Past Medical History  Diagnosis Date  . Pulmonary embolism   . Pleural effusion   . PTSD (post-traumatic stress disorder)   . Migraines    Current Facility-Administered Medications  Medication Dose Route Frequency Provider Last Rate Last Dose  . acetaminophen (TYLENOL) tablet 650 mg  650 mg Oral Q6H PRN Debby Crosley, MD   650 mg at 09/28/11 1704   Or  . acetaminophen (TYLENOL) suppository 650 mg  650 mg Rectal Q6H PRN Debby Crosley, MD      . ALPRAZolam Prudy Feeler) tablet 0.5 mg  0.5 mg Oral BID PRN Debby Crosley, MD   0.5 mg at 09/28/11 2210  . citalopram (CELEXA) tablet 40 mg  40 mg Oral Daily Nehemiah Settle, MD   40 mg at 09/30/11 0915  . HYDROcodone-acetaminophen (NORCO) 5-325 MG per tablet 1-2 tablet  1-2 tablet Oral Q4H PRN Gery Pray, MD   2 tablet at 09/30/11 0839  . morphine 2 MG/ML injection 1 mg  1 mg Intravenous Once Belkys A Regalado, MD   1 mg at 09/29/11 1829  . nitroGLYCERIN (NITROSTAT) SL tablet 0.4 mg  0.4 mg Sublingual Q5 Min x 3 PRN Debby Crosley, MD      . ondansetron (ZOFRAN) tablet 4 mg  4 mg Oral Q6H PRN Debby Crosley, MD       Or  . ondansetron (ZOFRAN) injection 4 mg  4 mg Intravenous Q6H PRN Debby Crosley, MD   4 mg at 09/30/11 0555  . pantoprazole (PROTONIX) EC tablet 40 mg  40 mg Oral BID AC Belkys A Regalado, MD   40 mg at 09/30/11 0834  . prazosin (MINIPRESS) capsule 8 mg  8 mg Oral QHS Belkys A Regalado, MD   8 mg at 09/28/11 2208  . promethazine (PHENERGAN) injection 12.5 mg  12.5 mg Intravenous Q6H PRN Caroline More, NP   12.5 mg at 09/29/11 2155  . QUEtiapine (SEROQUEL) tablet 50 mg  50 mg Oral QHS Nehemiah Settle, MD      . rivaroxaban (XARELTO) tablet 20 mg  20 mg Oral Q supper Debby Crosley, MD   20 mg at 09/29/11 1751  . sodium chloride 0.9 % injection 3 mL  3 mL Intravenous Q12H Debby Crosley, MD   3 mL at 09/27/11 2335  . traMADol (ULTRAM) tablet 50 mg  50 mg Oral Q6H PRN Debby Crosley, MD   50 mg at 09/29/11 1124  . DISCONTD: 0.9 % NaCl with KCl 20 mEq/ L  infusion   Intravenous Continuous Kynsleigh Westendorf, MD 125 mL/hr at 09/30/11 0505    . DISCONTD: citalopram (CELEXA) tablet 20 mg  20 mg Oral Daily Debby Crosley, MD   20 mg at 09/29/11 0934  . DISCONTD: QUEtiapine (SEROQUEL) tablet 25 mg  25 mg Oral QHS Debby Crosley, MD   25 mg at 09/28/11 2210  . DISCONTD: simvastatin (ZOCOR) tablet 20 mg  20 mg Oral q1800 Debby Crosley, MD   20 mg at 09/29/11 1751   No Known Allergies  Principal Problem:  *Chest pain Active Problems:  Post traumatic stress disorder (PTSD)  Hx pulmonary embolism  Factor 5 Leiden mutation, heterozygous  Hypertension  Hyperlipidemia   Vital signs in last 24 hours: Temp:  [98 F (36.7 C)-98.6 F (37 C)] 98 F (36.7 C) (05/22 0500) Pulse Rate:  [64-87] 64  (05/22 0500) Resp:  [16-18] 16  (05/22 0500) BP: (121-133)/(64-74) 121/64 mmHg (05/22 0500) SpO2:  [97 %-100 %] 98 % (05/22 0500) Weight change:  Last BM Date: 09/30/11  Intake/Output from previous day:   Intake/Output this shift:    Lab Results:  Basename 09/30/11 0712 09/29/11 0630  WBC 4.7 6.1  HGB 14.2 14.3  HCT 40.9 41.6  PLT 236 242   BMET  Basename 09/30/11 0712 09/28/11 0700  NA 141 140  K 4.0 4.0  CL 108 106  CO2 24 23  GLUCOSE 92 98  BUN <3* 8  CREATININE 0.77 0.89  CALCIUM 9.1 9.0    Studies/Results: No results found.  Medications: I have reviewed the patient's current medications.   Physical exam GENERAL- alert HEAD- normal atraumatic, no neck masses, normal  thyroid, no jvd RESPIRATORY- appears well, vitals normal, no respiratory distress, acyanotic, normal RR, ear and throat exam is normal, neck free of mass or lymphadenopathy, chest clear, no wheezing, crepitations, rhonchi, normal symmetric air entry CVS- regular rate and rhythm, S1, S2 normal, no murmur, click, rub or gallop ABDOMEN- abdomen is soft without significant tenderness, masses, organomegaly or guarding NEURO- Grossly normal EXTREMITIES- extremities normal, atraumatic, no cyanosis or edema  Plan   * Chest pain-seems more of costochondritis, maybe in combination with Panic anxiety disorder. Continue pain meds as needed. Trial of nsaids. No need for telemetry. Active Problems:  * Post traumatic stress disorder (PTSD)- seems a major contributing factor to current presentation. Appreciate psych input. Follow recommendations. *  Hx pulmonary embolismFactor 5 Leiden mutation, heterozygous/- on xarelto *  Hypertension- controlled on current meds. * Hyperlipidemia- hold statin in view of abdominal symptoms. * vomiting/diarrhea- unclear etiology. Obtain CT abdomen/pelvis. Disposition- hopefully home tomorrow if st negative. Encourage ambulation in hallway.    Madysyn Hanken 09/30/2011 10:41 AM Pager: [phone].

## 2011-09-30 NOTE — Progress Notes (Signed)
Pt reported having BM consistent with diarrhea this am. Will continue to monitor.

## 2011-12-25 IMAGING — CT CT ANGIO CHEST
2 of 6 series · 19 of 36 positions shown · IV contrast (APPLIED)
Comparison: 05/03/2010

CLINICAL DATA: Chest pain cough.  Question pulmonary embolus.

CT ANGIOGRAPHY CHEST WITH CONTRAST
TECHNIQUE: Multidetector CT imaging of the chest was performed
using the standard protocol during bolus administration of
intravenous contrast.  Multiplanar CT image reconstructions
including MIPs were obtained to evaluate the vascular anatomy.
Contrast: 100mL OMNIPAQUE IOHEXOL 300 MG/ML IV SOLN

[Series 7: thins for pacs · axial · 0.74mm/px · z∈[+38,+244]mm · 18 of 230 slices shown]
[im 12/230  lung]
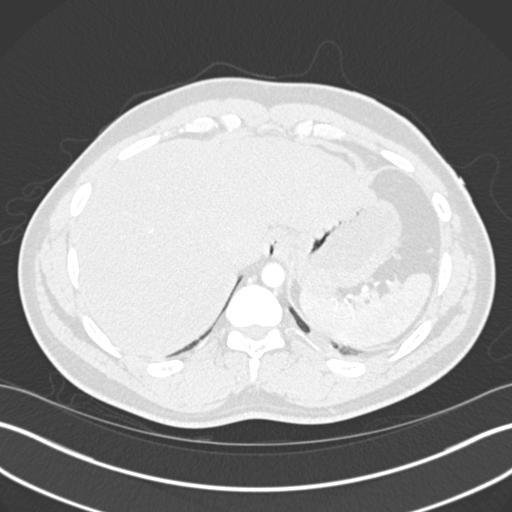
[im 23/230  mediastinal]
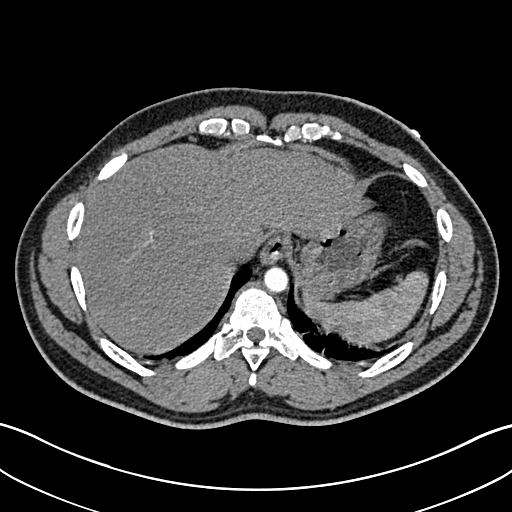
[im 35/230  lung]
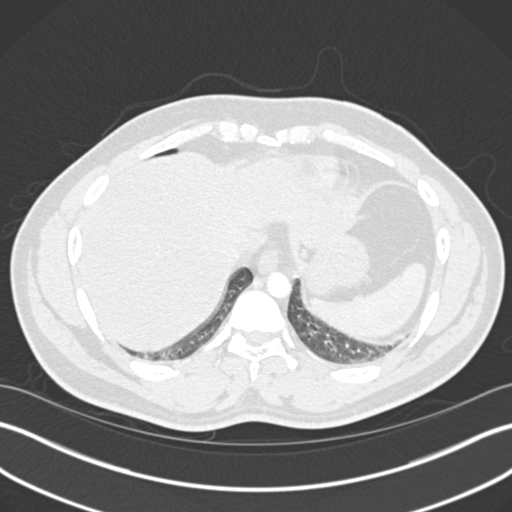
[im 46/230  mediastinal]
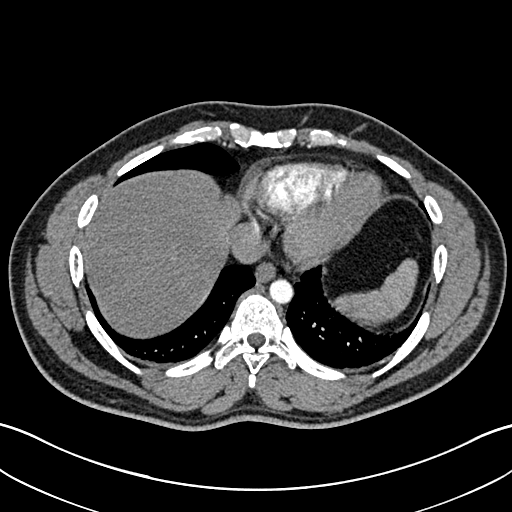
[im 58/230  lung]
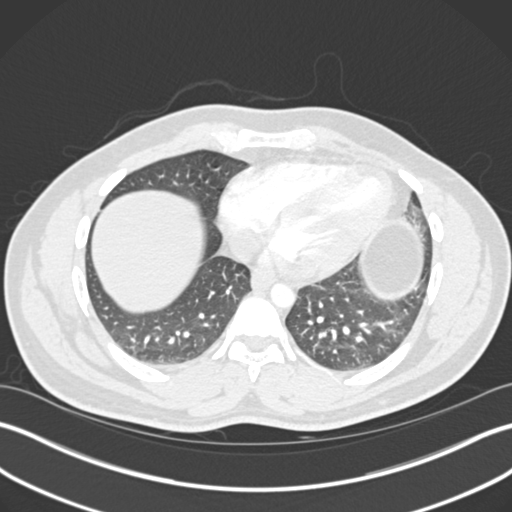
[im 69/230  mediastinal]
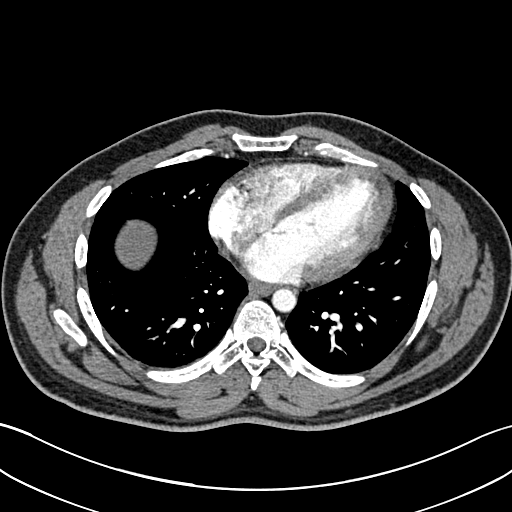
[im 81/230  lung]
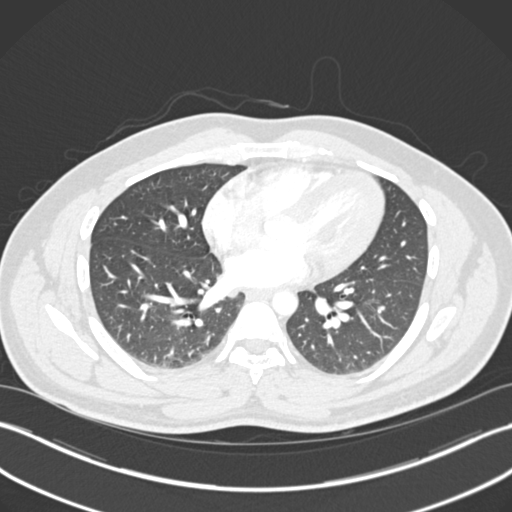
[im 92/230  mediastinal]
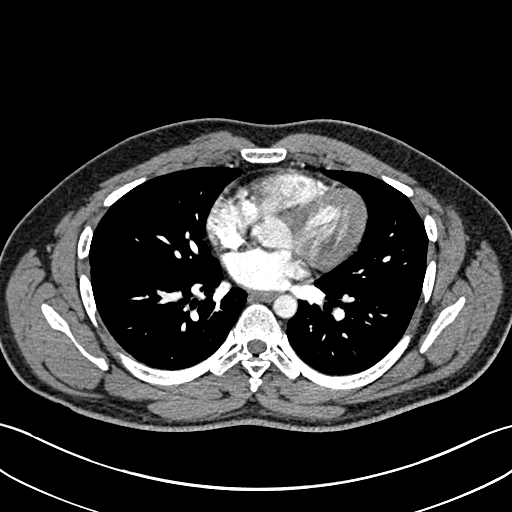
[im 104/230  lung]
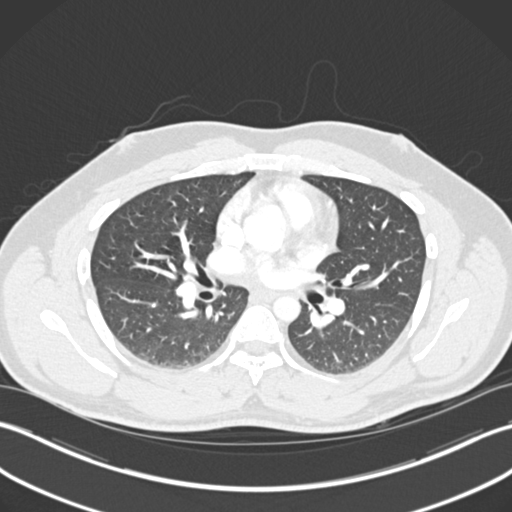
[im 126/230  mediastinal]
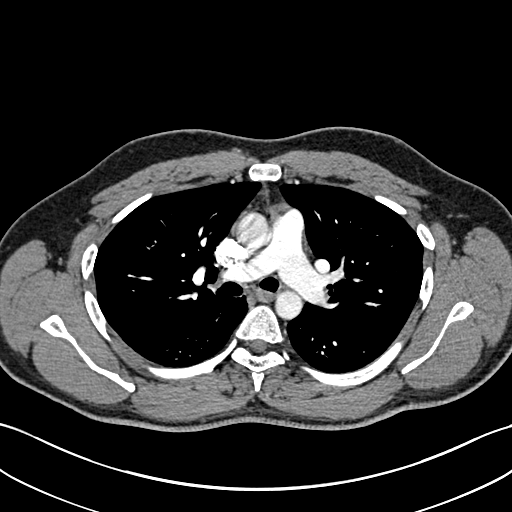
[im 138/230  lung]
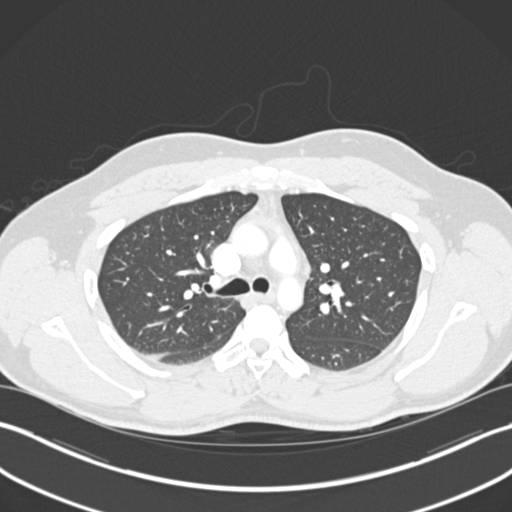
[im 149/230  mediastinal]
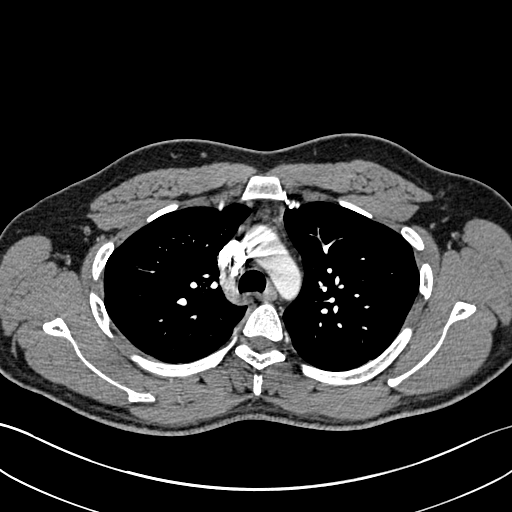
[im 161/230  lung]
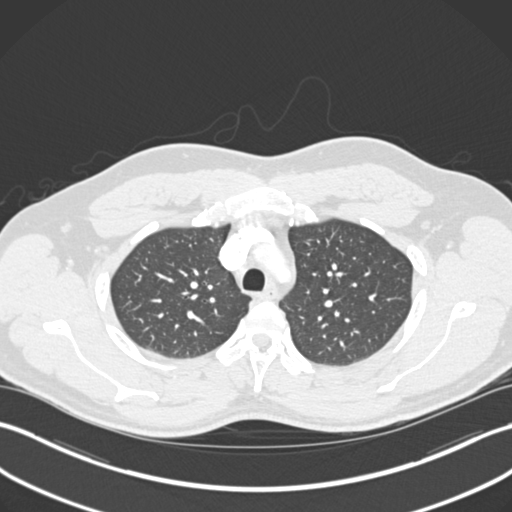
[im 172/230  mediastinal]
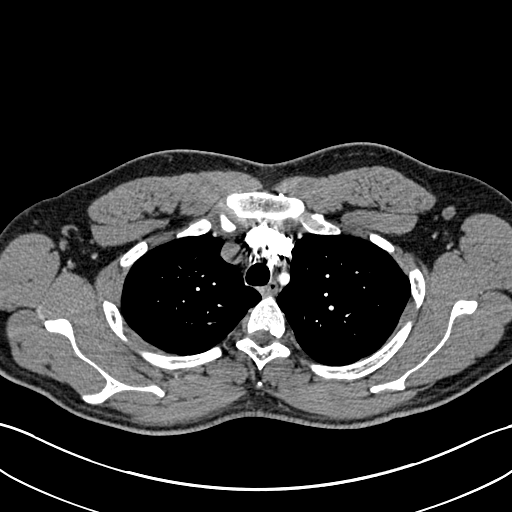
[im 184/230  lung]
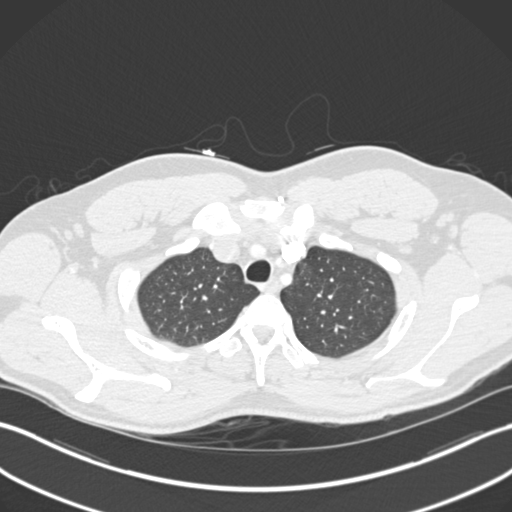
[im 195/230  mediastinal]
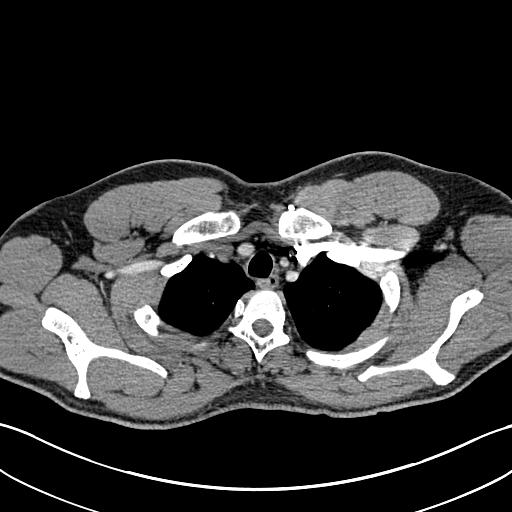
[im 207/230  lung]
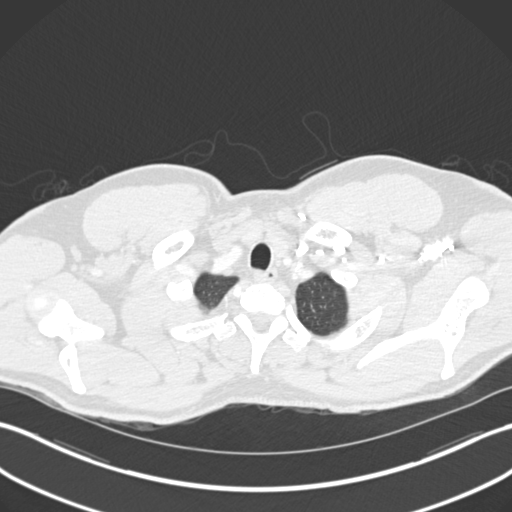
[im 218/230  mediastinal]
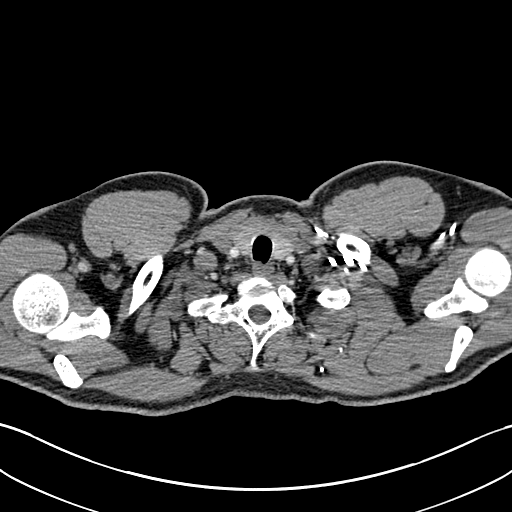

[Series 8: coronal mpr · coronal · 0.45mm/px · 1 of 109 slices shown]
[im 55/109  mediastinal]
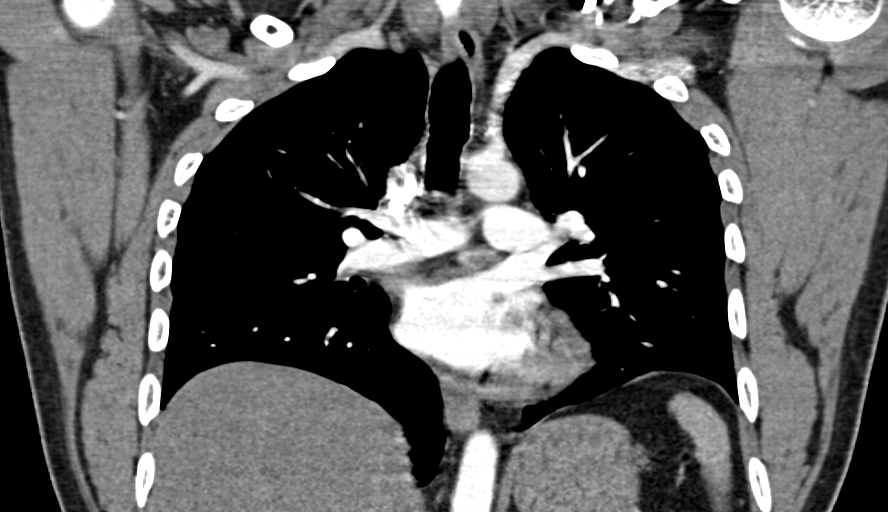

[19 of 36 positions shown; findings below may reference images not displayed]

FINDINGS: There is no filling defect within the opacified pulmonary arteries
to suggest the presence of an acute pulmonary embolus.  No thoracic
aortic aneurysm.  There is no dissection of the thoracic aorta.

No evidence for axillary, mediastinal, or hilar lymphadenopathy.
Heart size is normal.  There is no pericardial or pleural effusion.

Lungs are clear bilaterally.

Bone windows reveal no worrisome lytic or sclerotic osseous
lesions.

Review of the MIP images confirms the above findings.
IMPRESSION: No CT evidence for acute pulmonary embolus.  No findings to explain
the patient's history of chest pain.

## 2012-01-15 ENCOUNTER — Telehealth: Payer: Self-pay | Admitting: *Deleted

## 2012-01-15 NOTE — Telephone Encounter (Signed)
Spoke with pt today.   Gave pt new date and time for appts on 01/29/12 at 1000 am.   Pt voiced understanding.

## 2012-01-25 ENCOUNTER — Other Ambulatory Visit: Payer: Self-pay | Admitting: *Deleted

## 2012-01-27 ENCOUNTER — Telehealth: Payer: Self-pay | Admitting: Hematology and Oncology

## 2012-01-27 NOTE — Telephone Encounter (Signed)
Per 9/16 pof moved 9/20 appt to PM. S/w wife she is aware.

## 2012-01-28 ENCOUNTER — Ambulatory Visit: Admitting: Hematology and Oncology

## 2012-01-28 ENCOUNTER — Other Ambulatory Visit: Admitting: Lab

## 2012-01-29 ENCOUNTER — Other Ambulatory Visit: Admitting: Lab

## 2012-01-29 ENCOUNTER — Ambulatory Visit: Admitting: Family

## 2012-01-29 ENCOUNTER — Other Ambulatory Visit

## 2012-01-29 ENCOUNTER — Telehealth: Payer: Self-pay | Admitting: *Deleted

## 2012-01-29 NOTE — Telephone Encounter (Signed)
Pt  [facility] for appts today.   Called pt at home and spoke with wife.   Wife acknowledged that she did receive a call from a scheduler on 01/27/12 of pt's appts.   Wife stated pt was supposed to call office to cancel appts.   No calls nor messages received from pt.   Called pt on cell phone - number given by wife.   Left message on voice mail for pt to call nurse back. Pt's   Cell   Phone    [phone].

## 2012-03-01 ENCOUNTER — Telehealth: Payer: Self-pay | Admitting: *Deleted

## 2012-03-01 NOTE — Telephone Encounter (Signed)
Mailed out calendar to inform the patient of the rescheduled missed appointment

## 2012-03-22 ENCOUNTER — Encounter (INDEPENDENT_AMBULATORY_CARE_PROVIDER_SITE_OTHER): Payer: Self-pay | Admitting: General Surgery

## 2012-03-24 ENCOUNTER — Encounter (INDEPENDENT_AMBULATORY_CARE_PROVIDER_SITE_OTHER): Payer: Self-pay | Admitting: General Surgery

## 2012-03-24 ENCOUNTER — Ambulatory Visit (INDEPENDENT_AMBULATORY_CARE_PROVIDER_SITE_OTHER): Admitting: General Surgery

## 2012-03-24 ENCOUNTER — Telehealth: Payer: Self-pay | Admitting: *Deleted

## 2012-03-24 ENCOUNTER — Other Ambulatory Visit: Payer: Self-pay | Admitting: Nurse Practitioner

## 2012-03-24 VITALS — BP 146/86 | HR 80 | Temp 98.4°F | Resp 16 | Ht 72.0 in | Wt 206.8 lb

## 2012-03-24 DIAGNOSIS — K602 Anal fissure, unspecified: Secondary | ICD-10-CM

## 2012-03-24 NOTE — Telephone Encounter (Signed)
Please move f/u appointment to 11/21 @ 0945 with Dr. Dalene Carrow - pt is aware

## 2012-03-24 NOTE — Progress Notes (Signed)
Subjective:     Patient ID: Harold Cohen, male   DOB: 01-24-1981, 31 y.o.   MRN: [id number]  HPI The patient is a 31 year old male with a several month history of blood after bowel movements. Patient states that he has a large amount of pain with passing stool as well as on wiping and passing flatus. Patient had a colonoscopy which reveals internal hemorrhoids.  Patient states he does not have to strain to have bowel movements. Patient was given more cream as well as questionable spray ointment to help with pain status as a burning sensation when applied.  Review of Systems  Constitutional: Negative.   HENT: Negative.   Eyes: Negative.   Respiratory: Negative.   Cardiovascular: Negative.   Gastrointestinal: Negative.   Musculoskeletal: Negative.   Neurological: Negative.        Objective:   Physical Exam  Constitutional: He is oriented to person, place, and time. He appears well-developed.  HENT:  Head: Normocephalic and atraumatic.  Eyes: EOM are normal. Pupils are equal, round, and reactive to light.  Neck: Normal range of motion. Neck supple.  Cardiovascular: Normal rate.   Pulmonary/Chest: Effort normal and breath sounds normal.  Abdominal: Soft. Bowel sounds are normal.  Musculoskeletal: Normal range of motion.  Neurological: He is alert and oriented to person, place, and time.       Assessment:     This is a 31 year old male with likely anal fissure. I cannot examine the patient's rectum without pain. Secondary questionably the patient's blood as well as symptomatology comes from his anal fissure.    Plan:     1. I will prescribe diltiazem cream soaked with spasm at this time. Would also recommend applying lidocaine jelly prior to having bowel movements.  2. Will have the patient follow up with Dr. Maisie Cohen a colorectal specialist into 3 weeks.

## 2012-03-29 ENCOUNTER — Ambulatory Visit: Admitting: Family

## 2012-03-30 ENCOUNTER — Encounter (INDEPENDENT_AMBULATORY_CARE_PROVIDER_SITE_OTHER): Payer: Self-pay

## 2012-03-30 ENCOUNTER — Encounter (INDEPENDENT_AMBULATORY_CARE_PROVIDER_SITE_OTHER): Payer: Self-pay | Admitting: Internal Medicine

## 2012-03-30 ENCOUNTER — Telehealth: Payer: Self-pay | Admitting: *Deleted

## 2012-03-30 NOTE — Telephone Encounter (Signed)
Called pt at home to remind pt of f/u appt with md on 03/31/12.   Pt stated he would not be able to keep appt., and no reasons given.    Pt also did not come for lab draw as scheduled.   Pt wished to reschedule appts to another date.   Message to md.

## 2012-03-31 ENCOUNTER — Ambulatory Visit: Admitting: Hematology and Oncology

## 2012-03-31 ENCOUNTER — Telehealth: Payer: Self-pay | Admitting: *Deleted

## 2012-03-31 NOTE — Telephone Encounter (Signed)
Spoke with pt today and informed pt re: when pt is ready to be seen again, pt needs to call office for a scheduled appt .   Pt voiced understanding.

## 2012-04-06 ENCOUNTER — Encounter (INDEPENDENT_AMBULATORY_CARE_PROVIDER_SITE_OTHER): Payer: Self-pay | Admitting: General Surgery

## 2012-04-06 ENCOUNTER — Ambulatory Visit (INDEPENDENT_AMBULATORY_CARE_PROVIDER_SITE_OTHER): Admitting: General Surgery

## 2012-04-06 VITALS — BP 162/74 | HR 104 | Temp 98.6°F | Resp 20 | Ht 72.0 in | Wt 206.6 lb

## 2012-04-06 DIAGNOSIS — K602 Anal fissure, unspecified: Secondary | ICD-10-CM | POA: Insufficient documentation

## 2012-04-06 NOTE — Patient Instructions (Addendum)

## 2012-04-06 NOTE — Progress Notes (Signed)
No chief complaint on file.   HISTORY: Harold Cohen is a 31 y.o. male who presents to the office with rectal pain.  Other symptoms include bleeding.  He was diagnosed with an anal fissure by Dr Derrell Lolling.  He has been using the diltiazem cream QID.  He notes some improvement but not completely better.  His bleeding has decreased as well.  He is not having much success with using the lidocaine cream prior to BM's.     Past Medical History  Diagnosis Date  . Pulmonary embolism   . Pleural effusion   . PTSD (post-traumatic stress disorder)   . Migraines   . Depression   . Anxiety   . Insomnia   . Clotting disorder   . Heart murmur   . Hyperlipidemia   . Hypertension       No past surgical history on file.      Current Outpatient Prescriptions  Medication Sig Dispense Refill  . atorvastatin (LIPITOR) 20 MG tablet Take 20 mg by mouth daily.      . citalopram (CELEXA) 20 MG tablet Take 2 tablets (40 mg total) by mouth daily.  30 tablet  0  . ergocalciferol (VITAMIN D2) 50000 UNITS capsule Take 50,000 Units by mouth 2 (two) times a week. Taken on Tuesdays and Thursdays.      Marland Kitchen ibuprofen (ADVIL,MOTRIN) 200 MG tablet Take 200 mg by mouth every 6 (six) hours as needed.      . metoprolol (LOPRESSOR) 100 MG tablet Take 100 mg by mouth 2 (two) times daily.      . Multiple Vitamin (MULTIVITAMIN) tablet Take 2 tablets by mouth daily.      Marland Kitchen omeprazole (PRILOSEC) 40 MG capsule Take 40 mg by mouth daily.      . prazosin (MINIPRESS) 2 MG capsule Take 8 mg by mouth at bedtime.      Marland Kitchen QUEtiapine (SEROQUEL) 25 MG tablet Take 2 tablets (50 mg total) by mouth at bedtime.  30 tablet  0  . traMADol (ULTRAM) 50 MG tablet Take 50 mg by mouth every 6 (six) hours as needed. Maximum dose= 8 tablets per day; for pain.          No Known Allergies    Family History  Problem Relation Age of Onset  . Coronary artery disease      History   Social History  . Marital Status: Married    Spouse Name: N/A    Number of Children: N/A  . Years of Education: N/A   Social History Main Topics  . Smoking status: Never Smoker   . Smokeless tobacco: Not on file  . Alcohol Use: 1.2 oz/week    2 Glasses of wine per week  . Drug Use: No  . Sexually Active:    Other Topics Concern  . Not on file   Social History Narrative  . No narrative on file      REVIEW OF SYSTEMS - PERTINENT POSITIVES ONLY: Review of Systems - Negative except rectal bleeding and pain  EXAM: There were no vitals filed for this visit.  General appearance: alert, cooperative and no distress GI: soft, non-tender; bowel sounds normal; no masses,  no organomegaly Perianal: post anal fissure, tender to touch   ASSESSMENT AND PLAN:  Anal fissure- cont diltiazem cream, will start sitz baths and using lidocaine cream after baths.  He will work on keeping his BM's soft but formed.  I will see him back in 2 weeks to see how  he is progressing.   Vanita Panda, MD Colon and Rectal Surgery / General Surgery [facility]      Visit Diagnoses: No diagnosis found.  Primary Care Physician: Gwynneth Aliment, MD

## 2012-05-02 ENCOUNTER — Encounter (INDEPENDENT_AMBULATORY_CARE_PROVIDER_SITE_OTHER): Admitting: General Surgery

## 2012-05-23 ENCOUNTER — Encounter (INDEPENDENT_AMBULATORY_CARE_PROVIDER_SITE_OTHER): Admitting: General Surgery

## 2012-05-25 ENCOUNTER — Encounter (INDEPENDENT_AMBULATORY_CARE_PROVIDER_SITE_OTHER): Payer: Self-pay | Admitting: General Surgery

## 2012-06-28 ENCOUNTER — Encounter (HOSPITAL_COMMUNITY): Payer: Self-pay | Admitting: *Deleted

## 2012-06-28 ENCOUNTER — Emergency Department (HOSPITAL_COMMUNITY)

## 2012-06-28 ENCOUNTER — Observation Stay (HOSPITAL_COMMUNITY)
Admission: EM | Admit: 2012-06-28 | Discharge: 2012-06-30 | Disposition: A | Discharge status: Home / Self Care | Attending: Internal Medicine | Admitting: Internal Medicine

## 2012-06-28 DIAGNOSIS — R200 Anesthesia of skin: Secondary | ICD-10-CM

## 2012-06-28 DIAGNOSIS — F329 Major depressive disorder, single episode, unspecified: Secondary | ICD-10-CM | POA: Insufficient documentation

## 2012-06-28 DIAGNOSIS — I749 Embolism and thrombosis of unspecified artery: Secondary | ICD-10-CM

## 2012-06-28 DIAGNOSIS — R079 Chest pain, unspecified: Secondary | ICD-10-CM | POA: Insufficient documentation

## 2012-06-28 DIAGNOSIS — E785 Hyperlipidemia, unspecified: Secondary | ICD-10-CM | POA: Insufficient documentation

## 2012-06-28 DIAGNOSIS — G43109 Migraine with aura, not intractable, without status migrainosus: Principal | ICD-10-CM | POA: Diagnosis present

## 2012-06-28 DIAGNOSIS — D6851 Activated protein C resistance: Secondary | ICD-10-CM

## 2012-06-28 DIAGNOSIS — R29898 Other symptoms and signs involving the musculoskeletal system: Secondary | ICD-10-CM

## 2012-06-28 DIAGNOSIS — I2699 Other pulmonary embolism without acute cor pulmonale: Secondary | ICD-10-CM

## 2012-06-28 DIAGNOSIS — F411 Generalized anxiety disorder: Secondary | ICD-10-CM | POA: Insufficient documentation

## 2012-06-28 DIAGNOSIS — Z86711 Personal history of pulmonary embolism: Secondary | ICD-10-CM | POA: Diagnosis present

## 2012-06-28 DIAGNOSIS — R51 Headache: Secondary | ICD-10-CM

## 2012-06-28 DIAGNOSIS — F3289 Other specified depressive episodes: Secondary | ICD-10-CM | POA: Insufficient documentation

## 2012-06-28 DIAGNOSIS — Z79899 Other long term (current) drug therapy: Secondary | ICD-10-CM | POA: Insufficient documentation

## 2012-06-28 DIAGNOSIS — R491 Aphonia: Secondary | ICD-10-CM

## 2012-06-28 DIAGNOSIS — F4310 Post-traumatic stress disorder, unspecified: Secondary | ICD-10-CM | POA: Insufficient documentation

## 2012-06-28 DIAGNOSIS — K219 Gastro-esophageal reflux disease without esophagitis: Secondary | ICD-10-CM | POA: Insufficient documentation

## 2012-06-28 DIAGNOSIS — I10 Essential (primary) hypertension: Secondary | ICD-10-CM | POA: Insufficient documentation

## 2012-06-28 DIAGNOSIS — K602 Anal fissure, unspecified: Secondary | ICD-10-CM

## 2012-06-28 HISTORY — DX: Gastro-esophageal reflux disease without esophagitis: K21.9

## 2012-06-28 HISTORY — DX: Shortness of breath: R06.02

## 2012-06-28 LAB — CBC WITH DIFFERENTIAL/PLATELET
Basophils Relative: 0 % (ref 0–1)
Eosinophils Absolute: 0.1 10*3/uL (ref 0.0–0.7)
Eosinophils Relative: 1 % (ref 0–5)
HCT: 41.8 % (ref 39.0–52.0)
Hemoglobin: 14.2 g/dL (ref 13.0–17.0)
MCH: 28.3 pg (ref 26.0–34.0)
MCHC: 34 g/dL (ref 30.0–36.0)
Monocytes Absolute: 0.5 10*3/uL (ref 0.1–1.0)
Monocytes Relative: 7 % (ref 3–12)

## 2012-06-28 LAB — COMPREHENSIVE METABOLIC PANEL
Albumin: 3.8 g/dL (ref 3.5–5.2)
BUN: 7 mg/dL (ref 6–23)
Creatinine, Ser: 0.83 mg/dL (ref 0.50–1.35)
Total Protein: 7.4 g/dL (ref 6.0–8.3)

## 2012-06-28 LAB — D-DIMER, QUANTITATIVE: D-Dimer, Quant: <0.27 ug/mL-FEU (ref 0.00–0.48)

## 2012-06-28 MED ORDER — ONDANSETRON HCL 4 MG/2ML IJ SOLN
4.0000 mg | Freq: Once | INTRAMUSCULAR | Status: AC
  Administered 2012-06-28: 4 mg via INTRAVENOUS
  Filled 2012-06-28: qty 2

## 2012-06-28 MED ORDER — FENTANYL CITRATE 0.05 MG/ML IJ SOLN
100.0000 ug | Freq: Once | INTRAMUSCULAR | Status: AC
  Administered 2012-06-28: 100 ug via INTRAVENOUS
  Filled 2012-06-28: qty 2

## 2012-06-28 NOTE — ED Notes (Signed)
Pt c/o chest pain x 3 days; increasingly worse; sharp pain; arm feels numb with pain; nauseated; dizzy; diaphoretic

## 2012-06-29 ENCOUNTER — Emergency Department (HOSPITAL_COMMUNITY)

## 2012-06-29 ENCOUNTER — Encounter (HOSPITAL_COMMUNITY): Payer: Self-pay | Admitting: General Practice

## 2012-06-29 ENCOUNTER — Inpatient Hospital Stay (HOSPITAL_COMMUNITY)

## 2012-06-29 DIAGNOSIS — G43109 Migraine with aura, not intractable, without status migrainosus: Secondary | ICD-10-CM | POA: Diagnosis present

## 2012-06-29 DIAGNOSIS — I517 Cardiomegaly: Secondary | ICD-10-CM

## 2012-06-29 LAB — URINALYSIS, ROUTINE W REFLEX MICROSCOPIC
Glucose, UA: NEGATIVE mg/dL
Hgb urine dipstick: NEGATIVE
Leukocytes, UA: NEGATIVE
Specific Gravity, Urine: 1.018 (ref 1.005–1.030)
Urobilinogen, UA: 1 mg/dL (ref 0.0–1.0)

## 2012-06-29 LAB — GLUCOSE, CAPILLARY
Glucose-Capillary: 100 mg/dL — ABNORMAL HIGH (ref 70–99)
Glucose-Capillary: 101 mg/dL — ABNORMAL HIGH (ref 70–99)
Glucose-Capillary: 126 mg/dL — ABNORMAL HIGH (ref 70–99)
Glucose-Capillary: 99 mg/dL (ref 70–99)

## 2012-06-29 LAB — RAPID URINE DRUG SCREEN, HOSP PERFORMED
Opiates: NOT DETECTED
Tetrahydrocannabinol: NOT DETECTED

## 2012-06-29 LAB — CBC
Hemoglobin: 13.9 g/dL (ref 13.0–17.0)
MCHC: 34.2 g/dL (ref 30.0–36.0)
Platelets: 218 10*3/uL (ref 150–400)
RDW: 13.1 % (ref 11.5–15.5)

## 2012-06-29 LAB — CREATININE, SERUM
Creatinine, Ser: 0.86 mg/dL (ref 0.50–1.35)
GFR calc non Af Amer: >90 mL/min (ref >90)

## 2012-06-29 LAB — APTT: aPTT: 29 seconds (ref 24–37)

## 2012-06-29 LAB — PROTIME-INR: Prothrombin Time: 12.9 seconds (ref 11.6–15.2)

## 2012-06-29 LAB — TROPONIN I: Troponin I: <0.3 ng/mL (ref <0.30)

## 2012-06-29 MED ORDER — PANTOPRAZOLE SODIUM 40 MG PO TBEC
40.0000 mg | DELAYED_RELEASE_TABLET | Freq: Every day | ORAL | Status: DC
  Administered 2012-06-30: 40 mg via ORAL
  Filled 2012-06-29: qty 1

## 2012-06-29 MED ORDER — ASPIRIN 325 MG PO TABS
325.0000 mg | ORAL_TABLET | Freq: Every day | ORAL | Status: DC
  Administered 2012-06-30: 325 mg via ORAL
  Filled 2012-06-29: qty 1

## 2012-06-29 MED ORDER — LORAZEPAM 2 MG/ML IJ SOLN
1.0000 mg | Freq: Once | INTRAMUSCULAR | Status: AC
  Administered 2012-06-29: 1 mg via INTRAVENOUS
  Filled 2012-06-29: qty 1

## 2012-06-29 MED ORDER — HYDROMORPHONE HCL PF 1 MG/ML IJ SOLN
1.0000 mg | Freq: Once | INTRAMUSCULAR | Status: AC
  Administered 2012-06-29: 1 mg via INTRAVENOUS
  Filled 2012-06-29: qty 1

## 2012-06-29 MED ORDER — METOCLOPRAMIDE HCL 5 MG/ML IJ SOLN
10.0000 mg | Freq: Three times a day (TID) | INTRAMUSCULAR | Status: DC
  Administered 2012-06-29 – 2012-06-30 (×4): 10 mg via INTRAVENOUS
  Filled 2012-06-29 (×6): qty 2

## 2012-06-29 MED ORDER — TRAMADOL HCL 50 MG PO TABS
50.0000 mg | ORAL_TABLET | Freq: Four times a day (QID) | ORAL | Status: DC | PRN
  Administered 2012-06-29: 50 mg via ORAL
  Filled 2012-06-29: qty 1

## 2012-06-29 MED ORDER — VITAMIN D (ERGOCALCIFEROL) 1.25 MG (50000 UNIT) PO CAPS
50000.0000 [IU] | ORAL_CAPSULE | ORAL | Status: DC
Start: 2012-06-30 — End: 2012-06-30
  Administered 2012-06-30: 50000 [IU] via ORAL
  Filled 2012-06-29: qty 1

## 2012-06-29 MED ORDER — SUMATRIPTAN SUCCINATE 6 MG/0.5ML ~~LOC~~ SOLN
6.0000 mg | SUBCUTANEOUS | Status: DC | PRN
  Administered 2012-06-29 – 2012-06-30 (×3): 6 mg via SUBCUTANEOUS
  Filled 2012-06-29 (×6): qty 0.5

## 2012-06-29 MED ORDER — QUETIAPINE FUMARATE 50 MG PO TABS
50.0000 mg | ORAL_TABLET | Freq: Every day | ORAL | Status: DC
  Administered 2012-06-29: 50 mg via ORAL
  Filled 2012-06-29 (×2): qty 1

## 2012-06-29 MED ORDER — ATORVASTATIN CALCIUM 20 MG PO TABS
20.0000 mg | ORAL_TABLET | Freq: Every day | ORAL | Status: DC
  Filled 2012-06-29 (×2): qty 1

## 2012-06-29 MED ORDER — ERGOCALCIFEROL 1.25 MG (50000 UT) PO CAPS: 50000.0000 [IU] | ORAL_CAPSULE | ORAL | Status: DC

## 2012-06-29 MED ORDER — CITALOPRAM HYDROBROMIDE 40 MG PO TABS
40.0000 mg | ORAL_TABLET | Freq: Every day | ORAL | Status: DC
  Administered 2012-06-29 – 2012-06-30 (×2): 40 mg via ORAL
  Filled 2012-06-29 (×2): qty 1

## 2012-06-29 MED ORDER — ACETAMINOPHEN 325 MG PO TABS
650.0000 mg | ORAL_TABLET | ORAL | Status: DC | PRN
  Administered 2012-06-29: 650 mg via ORAL
  Filled 2012-06-29: qty 2

## 2012-06-29 MED ORDER — METOCLOPRAMIDE HCL 5 MG/ML IJ SOLN
10.0000 mg | Freq: Once | INTRAMUSCULAR | Status: AC
  Administered 2012-06-29: 10 mg via INTRAVENOUS
  Filled 2012-06-29: qty 2

## 2012-06-29 MED ORDER — PRAZOSIN HCL 5 MG PO CAPS
8.0000 mg | ORAL_CAPSULE | Freq: Every day | ORAL | Status: DC
  Administered 2012-06-29: 8 mg via ORAL
  Filled 2012-06-29 (×2): qty 1

## 2012-06-29 MED ORDER — TRIAMCINOLONE ACETONIDE 0.5 % EX CREA
TOPICAL_CREAM | Freq: Two times a day (BID) | CUTANEOUS | Status: DC
  Administered 2012-06-29 – 2012-06-30 (×2): via TOPICAL
  Filled 2012-06-29: qty 15

## 2012-06-29 MED ORDER — PROCHLORPERAZINE EDISYLATE 5 MG/ML IJ SOLN: 10.0000 mg | Freq: Four times a day (QID) | INTRAMUSCULAR | Status: DC | PRN

## 2012-06-29 MED ORDER — ADULT MULTIVITAMIN W/MINERALS CH
1.0000 | ORAL_TABLET | Freq: Every day | ORAL | Status: DC
  Administered 2012-06-29 – 2012-06-30 (×2): 1 via ORAL
  Filled 2012-06-29 (×2): qty 1

## 2012-06-29 MED ORDER — HYDROMORPHONE HCL PF 1 MG/ML IJ SOLN
1.0000 mg | INTRAMUSCULAR | Status: DC | PRN
  Administered 2012-06-29: 1 mg via INTRAVENOUS
  Filled 2012-06-29 (×2): qty 1

## 2012-06-29 MED ORDER — DIPHENHYDRAMINE HCL 50 MG/ML IJ SOLN
25.0000 mg | Freq: Once | INTRAMUSCULAR | Status: AC
Start: 2012-06-29 — End: 2012-06-29
  Administered 2012-06-29: 25 mg via INTRAVENOUS
  Filled 2012-06-29: qty 1

## 2012-06-29 MED ORDER — IBUPROFEN 600 MG PO TABS
600.0000 mg | ORAL_TABLET | Freq: Four times a day (QID) | ORAL | Status: DC | PRN
  Administered 2012-06-29: 600 mg via ORAL
  Filled 2012-06-29: qty 1

## 2012-06-29 MED ORDER — LIDOCAINE HCL 2 % EX GEL
1.0000 "application " | CUTANEOUS | Status: DC | PRN
  Filled 2012-06-29: qty 5

## 2012-06-29 MED ORDER — METOPROLOL TARTRATE 100 MG PO TABS
100.0000 mg | ORAL_TABLET | Freq: Two times a day (BID) | ORAL | Status: DC
  Administered 2012-06-29 – 2012-06-30 (×3): 100 mg via ORAL
  Filled 2012-06-29 (×4): qty 1

## 2012-06-29 MED ORDER — ONE-DAILY MULTI VITAMINS PO TABS: 2.0000 | ORAL_TABLET | Freq: Every day | ORAL | Status: DC

## 2012-06-29 MED ORDER — ENOXAPARIN SODIUM 40 MG/0.4ML ~~LOC~~ SOLN
40.0000 mg | SUBCUTANEOUS | Status: DC
  Administered 2012-06-29 – 2012-06-30 (×2): 40 mg via SUBCUTANEOUS
  Filled 2012-06-29 (×2): qty 0.4

## 2012-06-29 NOTE — H&P (Signed)
[facility] History and Physical  Harold Cohen OZH:[id number] DOB: 1980/07/08    PCP:   Gwynneth Aliment, MD   Chief Complaint: multiple complaints:  HA, left upper extremity weakness, chest pain, and general malaise.  HPI: Harold Cohen is an 32 y.o. male with significant psych history, including anxiety, PTSD, depression, hx of bilateral PE, ? Clotting disorder (was told he had LydenV, then later tested negative), history on non cardiac chest pain, migaine, presents with vague chest pain and left upper extremity paresthesia along with weakness.  He also complaints of having zig-zig lines in the peripheral visual field bilaterally.  He has no shortness of breath, lower extremitiy weakness, nausea and vomiting.  Evaluation in the ER showed a normal head CT.  Serologies were rather unremarkable as well.  Hospitalist was asked to admit this patient for possible TIA/CVA.  Rewiew of Systems:  Constitutional: Negative for malaise, fever and chills. No significant weight loss or weight gain Eyes: Negative for eye pain, redness and discharge, diplopia,  or flashes of light. ENMT: Negative for ear pain, hoarseness, nasal congestion, sinus pressure and sore throat. No headaches; tinnitus, drooling, or problem swallowing. Cardiovascular: Negative for chest pain, palpitations, diaphoresis, dyspnea and peripheral edema. ; No orthopnea, PND Respiratory: Negative for cough, hemoptysis, wheezing and stridor. No pleuritic chestpain. Gastrointestinal: Negative for nausea, vomiting, diarrhea, constipation, abdominal pain, melena, blood in stool, hematemesis, jaundice and rectal bleeding.    Genitourinary: Negative for frequency, dysuria, incontinence,flank pain and hematuria; Musculoskeletal: Negative for back pain and neck pain. Negative for swelling and trauma.;  Skin: . Negative for pruritus, rash, abrasions, bruising and skin lesion.; ulcerations Neuro: Negative for headache, lightheadedness and  neck stiffness. Negative for weakness, altered level of consciousness , altered mental status,  burning feet, involuntary movement, seizure and syncope.  Psych: negative for  insomnia, tearfulness, panic attacks, hallucinations, paranoia, suicidal or homicidal ideation    Past Medical History  Diagnosis Date  . Pulmonary embolism   . Pleural effusion   . PTSD (post-traumatic stress disorder)   . Migraines   . Depression   . Anxiety   . Insomnia   . Clotting disorder   . Heart murmur   . Hyperlipidemia   . Hypertension     History reviewed. No pertinent past surgical history.  Medications:  HOME MEDS: Prior to Admission medications   Medication Sig Start Date End Date Taking? Authorizing Provider  atorvastatin (LIPITOR) 20 MG tablet Take 20 mg by mouth daily.   Yes Historical Provider, MD  citalopram (CELEXA) 20 MG tablet Take 2 tablets (40 mg total) by mouth daily. 09/30/11  Yes Simbiso Ranga, MD  ergocalciferol (VITAMIN D2) 50000 UNITS capsule Take 50,000 Units by mouth 2 (two) times a week. Taken on Tuesdays and Thursdays.   Yes Historical Provider, MD  metoprolol (LOPRESSOR) 100 MG tablet Take 100 mg by mouth 2 (two) times daily.   Yes Historical Provider, MD  Multiple Vitamin (MULTIVITAMIN) tablet Take 2 tablets by mouth daily.   Yes Historical Provider, MD  omeprazole (PRILOSEC) 40 MG capsule Take 40 mg by mouth daily.   Yes Historical Provider, MD  prazosin (MINIPRESS) 2 MG capsule Take 8 mg by mouth at bedtime.   Yes Historical Provider, MD  QUEtiapine (SEROQUEL) 25 MG tablet Take 2 tablets (50 mg total) by mouth at bedtime. 09/30/11  Yes Simbiso Ranga, MD  traMADol (ULTRAM) 50 MG tablet Take 50 mg by mouth every 6 (six) hours as needed. Maximum dose= 8 tablets per day;  for pain.   Yes Historical Provider, MD  betamethasone dipropionate (DIPROLENE) 0.05 % cream Apply topically 4 (four) times daily.    Historical Provider, MD  ibuprofen (ADVIL,MOTRIN) 200 MG tablet Take 200 mg  by mouth every 6 (six) hours as needed. For pain    Historical Provider, MD  lidocaine (XYLOCAINE) 2 % jelly Apply 1 application topically as needed. For pain    Historical Provider, MD     Allergies:  No Known Allergies  Social History:   reports that he has never smoked. He has never used smokeless tobacco. He reports that he drinks about 1.2 ounces of alcohol per week. He reports that he does not use illicit drugs.  Family History: Family History  Problem Relation Age of Onset  . Coronary artery disease       Physical Exam: Filed Vitals:   06/29/12 0142 06/29/12 0330 06/29/12 0400 06/29/12 0430  BP:  125/72 132/83 121/71  Pulse:  74 87 73  Temp: 98.4 F (36.9 C)     Resp:  13 17 9   SpO2:  97% 99% 98%   Blood pressure 121/71, pulse 73, temperature 98.4 F (36.9 C), resp. rate 9, SpO2 98.00%.  GEN:  Pleasant  patient lying in the stretcher in no acute distress; cooperative with exam. PSYCH:  alert and oriented x4; does not appear anxious or depressed; affect is appropriate. HEENT: Mucous membranes pink and anicteric; PERRLA; EOM intact; no cervical lymphadenopathy nor thyromegaly or carotid bruit; no JVD; There were no stridor. Neck is very supple. Breasts:: Not examined CHEST WALL: No tenderness CHEST: Normal respiration, clear to auscultation bilaterally.  HEART: Regular rate and rhythm.  There are no murmur, rub, or gallops.   BACK: No kyphosis or scoliosis; no CVA tenderness ABDOMEN: soft and non-tender; no masses, no organomegaly, normal abdominal bowel sounds; no pannus; no intertriginous candida. There is no rebound and no distention. Rectal Exam: Not done EXTREMITIES: No bone or joint deformity; age-appropriate arthropathy of the hands and knees; no edema; no ulcerations.  There is no calf tenderness. Genitalia: not examined PULSES: 2+ and symmetric SKIN: Normal hydration no rash or ulceration CNS: Cranial nerves 2-12 grossly intact no focal lateralizing  neurologic deficit.  Speech is fluent; uvula elevated with phonation, facial symmetry and tongue midline. DTR are normal bilaterally, cerebella exam is intact, barbinski is negative and left upper extremity is slightly weaker than right.  No sensory loss.   Labs on Admission:  Basic Metabolic Panel:  Recent Labs Lab 06/28/12 2240  NA 137  K 3.8  CL 103  CO2 24  GLUCOSE 100*  BUN 7  CREATININE 0.83  CALCIUM 8.8   Liver Function Tests:  Recent Labs Lab 06/28/12 2240  AST 24  ALT 34  ALKPHOS 50  BILITOT 0.2*  PROT 7.4  ALBUMIN 3.8   No results found for this basename: LIPASE, AMYLASE,  in the last 168 hours No results found for this basename: AMMONIA,  in the last 168 hours CBC:  Recent Labs Lab 06/28/12 2240  WBC 6.7  NEUTROABS 2.0  HGB 14.2  HCT 41.8  MCV 83.4  PLT 265   Cardiac Enzymes: No results found for this basename: CKTOTAL, CKMB, CKMBINDEX, TROPONINI,  in the last 168 hours  CBG:  Recent Labs Lab 06/29/12 0010  GLUCAP 101*     Radiological Exams on Admission: Dg Chest 2 View  06/28/2012  *RADIOLOGY REPORT*  Clinical Data: Chest pain  CHEST - 2 VIEW  Comparison: 09/27/2011  CT  Findings: Cardiomediastinal contours within normal range. Hypoaeration and mild interstitial crowding. Minimal retrocardiac linear opacity.  No confluent airspace opacity.  No pleural effusion or pneumothorax.  No acute osseous finding.  IMPRESSION: Hypoaeration with mild interstitial crowding and retrocardiac atelectasis   Original Report Authenticated By: Jearld Lesch, M.D.    Ct Head Wo Contrast  06/29/2012  *RADIOLOGY REPORT*  Clinical Data: Chest pain, dizzy  CT HEAD WITHOUT CONTRAST  Technique:  Contiguous axial images were obtained from the base of the skull through the vertex without contrast.  Comparison: None.  Findings: No acute intracranial hemorrhage, acute infarction, mass lesion, mass effect, hydrocephalus or midline shift.  Normal gray- white interface  throughout.  No cerebral edema.  Globes and orbits are intact.  No focal calvarial or soft tissue abnormality.  Normal aeration of the mastoid air cells and paranasal sinuses.  IMPRESSION: Negative head CT.   Original Report Authenticated By: Malachy Moan, M.D.     EKG Stach with no acute ST-T changes.  Assessment/Plan Present on Admission:  . Complicated migraine . Post traumatic stress disorder (PTSD) . Hx pulmonary embolism  PLAN:  Patient with significant psychiatric history as he was traumatized during the war when he was in the [place], with history of migraine, and prior PE (currently no longer on anticoagulation) presents with several complaints.  I suspect he has complicated migraine.  DDx would include TIA, conversion disorder, and left cervical radiculopathy.  His chest discomfort doesn't sound cardiac, and he had prior cardiac work up. I will cycle his troponin, but would be very surprised if it comes back positive.   I will transfer him to [facility] for TIA/CVA work up.  Treat his migraine with narcotic for now.  He is stable, full code, and will be admitted to [facility] service.    Other plans as per orders.  Code Status: FULL Unk Lightning, MD. [facility] Pager [phone] 7pm to 7am.  06/29/2012, 5:58 AM

## 2012-06-29 NOTE — ED Provider Notes (Signed)
History     CSN: [id number]  Arrival date & time 06/28/12  2159   First MD Initiated Contact with Patient 06/28/12 2350      Chief Complaint  Patient presents with  . Chest Pain   left arm weakness and numbness Nausea vomiting and diarrhea Headache  (Consider location/radiation/quality/duration/timing/severity/associated sxs/prior treatment) HPI This 32 year old male has multiple complaints. He complains of 3 days of a constant left-sided chest pain which is sharp stabbing and present 24 hours a day for the last few days. It is worse with palpation and position changes and deep breathing. The pain can range from mild to severe. He also has a few days of a gradual onset constant diffuse somewhat throbbing headache which was not sudden onset and not most severe at its onset. He has associated lightheadedness as well as vertigo worsens position changes especially if he stands up. He feels generally weak for the last few days. He has nausea and occasional vomiting over the last few days. He has multiple episodes of diarrhea for the last few days and he states his stool appears dark almost black in color. He denies taking any anti-inflammatories or blood thinners. He was last known well in terms of his neurologic status this morning at approximately 4 AM when he went to sleep but woke up at 11:00 this morning he noticed that his left arm was both weak and numb and has been constantly that way unchanged for over 12 hours now. He notices no change in speech vision swallowing or understanding and has no focal weakness or numbness to his right side of his body or to his left leg. There is no treatment prior to arrival. Past Medical History  Diagnosis Date  . Pulmonary embolism   . Pleural effusion   . PTSD (post-traumatic stress disorder)   . Migraines   . Depression   . Anxiety   . Insomnia   . Clotting disorder   . Heart murmur   . Hyperlipidemia   . Hypertension   . Shortness of breath   .  GERD (gastroesophageal reflux disease)     History reviewed. No pertinent past surgical history.  Family History  Problem Relation Age of Onset  . Coronary artery disease      History  Substance Use Topics  . Smoking status: Never Smoker   . Smokeless tobacco: Never Used  . Alcohol Use: 1.2 oz/week    2 Glasses of wine per week     Comment: DAILY      Review of Systems 10 Systems reviewed and are negative for acute change except as noted in the HPI. Allergies  Review of patient's allergies indicates no known allergies.  Home Medications   Current Outpatient Rx  Name  Route  Sig  Dispense  Refill  . atorvastatin (LIPITOR) 20 MG tablet   Oral   Take 20 mg by mouth daily.         . citalopram (CELEXA) 20 MG tablet   Oral   Take 2 tablets (40 mg total) by mouth daily.   30 tablet   0   . ergocalciferol (VITAMIN D2) 50000 UNITS capsule   Oral   Take 50,000 Units by mouth 2 (two) times a week. Taken on Tuesdays and Thursdays.         . metoprolol (LOPRESSOR) 100 MG tablet   Oral   Take 100 mg by mouth 2 (two) times daily.         . Multiple  Vitamin (MULTIVITAMIN) tablet   Oral   Take 2 tablets by mouth daily.         Marland Kitchen omeprazole (PRILOSEC) 40 MG capsule   Oral   Take 40 mg by mouth daily.         . prazosin (MINIPRESS) 2 MG capsule   Oral   Take 8 mg by mouth at bedtime.         Marland Kitchen QUEtiapine (SEROQUEL) 25 MG tablet   Oral   Take 2 tablets (50 mg total) by mouth at bedtime.   30 tablet   0   . traMADol (ULTRAM) 50 MG tablet   Oral   Take 50 mg by mouth every 6 (six) hours as needed. Maximum dose= 8 tablets per day; for pain.         Marland Kitchen betamethasone dipropionate (DIPROLENE) 0.05 % cream   Topical   Apply topically 4 (four) times daily.         . divalproex (DEPAKOTE ER) 250 MG 24 hr tablet   Oral   Take 1 tablet (250 mg total) by mouth daily.   30 tablet   0   . ibuprofen (ADVIL,MOTRIN) 200 MG tablet   Oral   Take 200 mg by  mouth every 6 (six) hours as needed. For pain         . lidocaine (XYLOCAINE) 2 % jelly   Topical   Apply 1 application topically as needed. For pain         . metoCLOPramide (REGLAN) 5 MG tablet   Oral   Take 1 tablet (5 mg total) by mouth 3 (three) times daily as needed.   15 tablet   0     BP 136/65  Pulse 63  Temp(Src) 97.9 F (36.6 C) (Oral)  Resp 18  SpO2 99%  Physical Exam  Nursing note and vitals reviewed. Constitutional:  Awake, alert, nontoxic appearance with baseline speech for patient.  HENT:  Head: Atraumatic.  Mouth/Throat: No oropharyngeal exudate.  Eyes: EOM are normal. Pupils are equal, round, and reactive to light. Right eye exhibits no discharge. Left eye exhibits no discharge.  No nystagmus  Neck: Neck supple.  Cardiovascular: Normal rate and regular rhythm.   Murmur heard. Pulmonary/Chest: Effort normal and breath sounds normal. No stridor. No respiratory distress. He has no wheezes. He has no rales. He exhibits tenderness.  Exactly reproducible left-sided chest wall tenderness without rash noted  Abdominal: Soft. Bowel sounds are normal. He exhibits no mass. There is no tenderness. There is no rebound.  Musculoskeletal: He exhibits no tenderness.  Baseline ROM, moves extremities with no obvious new focal weakness.  Lymphadenopathy:    He has no cervical adenopathy.  Neurological: He is alert.  Awake, alert, cooperative and aware of situation; motor strength 5 out of 5 in the right arm and leg as well as the left leg but the left arm has 4/5 strength; sensation normal to light touch bilaterally except for left arm which has diffuse numbness; peripheral visual fields full to confrontation; no facial asymmetry; tongue midline; major cranial nerves appear intact;  slight abnormal pronator drift left arm no pronator drift to right arm or legs, normal finger to nose bilaterally, baseline gait without new ataxia.  Skin: No rash noted.  Psychiatric: He has  a normal mood and affect.    ED Course  Procedures (including critical care time) ECG: Sinus tachycardia, ventricular rate 110, normal axis, normal intervals, no acute ischemic changes noted, no significant change  noted compared with previous ECG except rate faster now  Labs Reviewed  CBC WITH DIFFERENTIAL - Abnormal; Notable for the following:    Neutrophils Relative 29 (*)    Lymphocytes Relative 63 (*)    Lymphs Abs 4.2 (*)    All other components within normal limits  COMPREHENSIVE METABOLIC PANEL - Abnormal; Notable for the following:    Glucose, Bld 100 (*)    Total Bilirubin 0.2 (*)    All other components within normal limits  GLUCOSE, CAPILLARY - Abnormal; Notable for the following:    Glucose-Capillary 101 (*)    All other components within normal limits  HEMOGLOBIN A1C - Abnormal; Notable for the following:    Hemoglobin A1C 6.0 (*)    Mean Plasma Glucose 126 (*)    All other components within normal limits  LIPID PANEL - Abnormal; Notable for the following:    Cholesterol 225 (*)    Triglycerides 222 (*)    HDL 38 (*)    VLDL 44 (*)    LDL Cholesterol 143 (*)    All other components within normal limits  GLUCOSE, CAPILLARY - Abnormal; Notable for the following:    Glucose-Capillary 120 (*)    All other components within normal limits  GLUCOSE, CAPILLARY - Abnormal; Notable for the following:    Glucose-Capillary 100 (*)    All other components within normal limits  GLUCOSE, CAPILLARY - Abnormal; Notable for the following:    Glucose-Capillary 126 (*)    All other components within normal limits  D-DIMER, QUANTITATIVE  PROTIME-INR  APTT  URINE RAPID DRUG SCREEN (HOSP PERFORMED)  URINALYSIS, ROUTINE W REFLEX MICROSCOPIC  TROPONIN I  CBC  CREATININE, SERUM  GLUCOSE, CAPILLARY  OCCULT BLOOD, POC DEVICE   No results found.   1. Complicated migraine   2. Hx pulmonary embolism   3. Hypertension   4. Post traumatic stress disorder (PTSD)   5. Left arm  weakness   6. Left arm numbness   7. Headache   8. Chest pain   9. Conversion aphonia   10. Embolism   11. Pulmonary embolism   12. Factor 5 Leiden mutation, heterozygous   76. Hyperlipidemia   14. Anal fissure       MDM  Pt stable in ED with no significant deterioration in condition.  Patient / Family / Caregiver informed of clinical course, understand medical decision-making process, and agree with plan.    The patient appears reasonably stabilized for admission considering the current resources, flow, and capabilities available in the ED at this time, and I doubt any other [facility] requiring further screening and/or treatment in the ED prior to admission.        Hurman Horn, MD 07/04/12 2013

## 2012-06-29 NOTE — ED Notes (Signed)
Pt alert and oriented x4. Respirations even and unlabored. Bilateral rise and fall of chest. Skin warm and dry. In no acute distress. Denies needs.  

## 2012-06-29 NOTE — Progress Notes (Signed)
  Echocardiogram 2D Echocardiogram has been performed.  Harold Cohen 06/29/2012, 10:33 AM

## 2012-06-29 NOTE — ED Notes (Signed)
Per previous RN, [facility] has been called and they will call for report when a truck comes available.

## 2012-06-29 NOTE — Progress Notes (Signed)
[facility] PROGRESS NOTE  Assessment/Plan: Complicated migraine with aura: - MRI brain 2.19.2014 pending. - Sumatriptan and ibuprofen for pain. Reglan for nausea - d/c narcotics as this may worsen migrane headache - MRI pending.    Post traumatic stress disorder (PTSD) - ativan.    Code Status: full Family Communication: wife  Disposition Plan: home in am   Consultants:  none  Procedures:  none  Antibiotics:  None  HPI/Subjective: Photophobia. Headache throbing  Objective: Filed Vitals:   06/29/12 0600 06/29/12 0630 06/29/12 0730 06/29/12 0830  BP: 121/74 140/81 132/72 127/69  Pulse: 65 75 72 70  Temp:    97.5 F (36.4 C)  TempSrc:    Oral  Resp: 12 12 12 14   SpO2: 97% 98% 98% 97%   No intake or output data in the 24 hours ending 06/29/12 1339 There were no vitals filed for this visit.  Exam:  General: Alert, awake, oriented x3, in no acute distress.  HEENT: No bruits, no goiter.  Heart: Regular rate and rhythm, without murmurs, rubs, gallops.  Lungs: Good air movement, clear to auscultation  Abdomen: Soft, nontender, nondistended, positive bowel sounds.  Neuro: Grossly intact, nonfocal.   Data Reviewed: Basic Metabolic Panel:  Recent Labs Lab 06/28/12 2240 06/29/12 0907  NA 137  --   K 3.8  --   CL 103  --   CO2 24  --   GLUCOSE 100*  --   BUN 7  --   CREATININE 0.83 0.86  CALCIUM 8.8  --    Liver Function Tests:  Recent Labs Lab 06/28/12 2240  AST 24  ALT 34  ALKPHOS 50  BILITOT 0.2*  PROT 7.4  ALBUMIN 3.8   No results found for this basename: LIPASE, AMYLASE,  in the last 168 hours No results found for this basename: AMMONIA,  in the last 168 hours CBC:  Recent Labs Lab 06/28/12 2240 06/29/12 0907  WBC 6.7 6.9  NEUTROABS 2.0  --   HGB 14.2 13.9  HCT 41.8 40.7  MCV 83.4 83.2  PLT 265 218   Cardiac Enzymes:  Recent Labs Lab 06/29/12 0633  TROPONINI <0.30   BNP (last 3 results) No results found for  this basename: PROBNP,  in the last 8760 hours CBG:  Recent Labs Lab 06/29/12 0010 06/29/12 0924 06/29/12 1135  GLUCAP 101* 120* 100*    No results found for this or any previous visit (from the past 240 hour(s)).   Studies: Dg Chest 2 View  06/28/2012  *RADIOLOGY REPORT*  Clinical Data: Chest pain  CHEST - 2 VIEW  Comparison: 09/27/2011 CT  Findings: Cardiomediastinal contours within normal range. Hypoaeration and mild interstitial crowding. Minimal retrocardiac linear opacity.  No confluent airspace opacity.  No pleural effusion or pneumothorax.  No acute osseous finding.  IMPRESSION: Hypoaeration with mild interstitial crowding and retrocardiac atelectasis   Original Report Authenticated By: Jearld Lesch, M.D.    Ct Head Wo Contrast  06/29/2012  *RADIOLOGY REPORT*  Clinical Data: Chest pain, dizzy  CT HEAD WITHOUT CONTRAST  Technique:  Contiguous axial images were obtained from the base of the skull through the vertex without contrast.  Comparison: None.  Findings: No acute intracranial hemorrhage, acute infarction, mass lesion, mass effect, hydrocephalus or midline shift.  Normal gray- white interface throughout.  No cerebral edema.  Globes and orbits are intact.  No focal calvarial or soft tissue abnormality.  Normal aeration of the mastoid air cells and paranasal sinuses.  IMPRESSION: Negative  head CT.   Original Report Authenticated By: Malachy Moan, M.D.     Scheduled Meds: . aspirin  325 mg Oral Daily  . atorvastatin  20 mg Oral q1800  . citalopram  40 mg Oral Daily  . enoxaparin (LOVENOX) injection  40 mg Subcutaneous Q24H  . LORazepam  1 mg Intravenous Once  . metoprolol  100 mg Oral BID  . multivitamin with minerals  1 tablet Oral Daily  . pantoprazole  40 mg Oral Q1200  . prazosin  8 mg Oral QHS  . QUEtiapine  50 mg Oral QHS  . triamcinolone cream   Topical BID  . [START ON 06/30/2012] Vitamin D (Ergocalciferol)  50,000 Units Oral 2 times weekly   Continuous  Infusions:    Marinda Elk  [facility] Pager [phone]. If 8PM-8AM, please contact night-coverage at [email or web address], password [facility] 06/29/2012, 1:39 PM  LOS: 1 day

## 2012-06-30 LAB — LIPID PANEL
LDL Cholesterol: 143 mg/dL — ABNORMAL HIGH (ref 0–99)
Triglycerides: 222 mg/dL — ABNORMAL HIGH (ref <150)
VLDL: 44 mg/dL — ABNORMAL HIGH (ref 0–40)

## 2012-06-30 MED ORDER — METOCLOPRAMIDE HCL 5 MG PO TABS: 5.0000 mg | ORAL_TABLET | Freq: Three times a day (TID) | ORAL | Status: DC | PRN

## 2012-06-30 MED ORDER — DIVALPROEX SODIUM ER 250 MG PO TB24
250.0000 mg | ORAL_TABLET | Freq: Every day | ORAL | Status: DC
  Filled 2012-06-30: qty 1

## 2012-06-30 MED ORDER — DIVALPROEX SODIUM ER 250 MG PO TB24: 250.0000 mg | ORAL_TABLET | Freq: Every day | ORAL | Status: DC

## 2012-06-30 NOTE — Discharge Summary (Signed)
Physician Discharge Summary  Harold Cohen OZH:[id number] DOB: 1980-07-09 DOA: 06/28/2012  PCP: Gwynneth Aliment, MD  Admit date: 06/28/2012 Discharge date: 06/30/2012  Time spent: 30 minutes  Recommendations for Outpatient Follow-up:  1. Follow up with PCP at [facility]  Discharge Diagnoses:  Principal Problem:   Complicated migraine Active Problems:   Post traumatic stress disorder (PTSD)   Hx pulmonary embolism   Conversion aphonia   Discharge Condition: stable  Diet recommendation: regular  There were no vitals filed for this visit.  History of present illness:  32 y.o. male with significant psych history, including anxiety, PTSD, depression, hx of bilateral PE, ? Clotting disorder (was told he had LydenV, then later tested negative), history on non cardiac chest pain, migaine, presents with vague chest pain and left upper extremity paresthesia along with weakness. He also complaints of having zig-zig lines in the peripheral visual field bilaterally. He has no shortness of breath, lower extremitiy weakness, nausea and vomiting. Evaluation in the ER showed a normal head CT. Serologies were rather unremarkable as well. Hospitalist was asked to admit this patient for possible TIA/CVA.   Hospital Course:  Complicated migraine with aura:  - MRI brain 2.19.2014 no acute findings. - Sumatriptan and ibuprofen for pain, did not help as weel as the Reglan for nausea and headache. - d/c narcotics. He did requested to stay on reglan PRN. - start Depakote for prophylaxis, get > 2 episode a month  Post traumatic stress disorder (PTSD)  - ativan.   Procedures:  MRI   Consultations:  none  Discharge Exam: Filed Vitals:   06/29/12 2226 06/30/12 0247 06/30/12 0635 06/30/12 1016  BP: 135/87 119/71 108/54 136/65  Pulse: 60 63 61 63  Temp: 97.7 F (36.5 C) 97.9 F (36.6 C) 97.7 F (36.5 C) 97.9 F (36.6 C)  TempSrc: Oral Oral Oral Oral  Resp: 20 20 20 18   SpO2: 100% 100% 99% 99%     General: A&O x3 Cardiovascular: RRR Respiratory: good air movement CTA B/L  Discharge Instructions  Discharge Orders   Future Orders Complete By Expires     Diet - low sodium heart healthy  As directed     Increase activity slowly  As directed         Medication List    TAKE these medications       atorvastatin 20 MG tablet  Commonly known as:  LIPITOR  Take 20 mg by mouth daily.     betamethasone dipropionate 0.05 % cream  Commonly known as:  DIPROLENE  Apply topically 4 (four) times daily.     citalopram 20 MG tablet  Commonly known as:  CELEXA  Take 2 tablets (40 mg total) by mouth daily.     divalproex 250 MG 24 hr tablet  Commonly known as:  DEPAKOTE ER  Take 1 tablet (250 mg total) by mouth daily.     ergocalciferol 50000 UNITS capsule  Commonly known as:  VITAMIN D2  Take 50,000 Units by mouth 2 (two) times a week. Taken on Tuesdays and Thursdays.     ibuprofen 200 MG tablet  Commonly known as:  ADVIL,MOTRIN  Take 200 mg by mouth every 6 (six) hours as needed. For pain     lidocaine 2 % jelly  Commonly known as:  XYLOCAINE  Apply 1 application topically as needed. For pain     metoCLOPramide 5 MG tablet  Commonly known as:  REGLAN  Take 1 tablet (5 mg total) by mouth 3 (three) times  daily as needed.     metoprolol 100 MG tablet  Commonly known as:  LOPRESSOR  Take 100 mg by mouth 2 (two) times daily.     multivitamin tablet  Take 2 tablets by mouth daily.     omeprazole 40 MG capsule  Commonly known as:  PRILOSEC  Take 40 mg by mouth daily.     prazosin 2 MG capsule  Commonly known as:  MINIPRESS  Take 8 mg by mouth at bedtime.     QUEtiapine 25 MG tablet  Commonly known as:  SEROQUEL  Take 2 tablets (50 mg total) by mouth at bedtime.     traMADol 50 MG tablet  Commonly known as:  ULTRAM  Take 50 mg by mouth every 6 (six) hours as needed. Maximum dose= 8 tablets per day; for pain.           Follow-up Information   Follow up  with Gwynneth Aliment, MD In 3 weeks. (hospital follow up)    Contact information:   [address] [place] [address] [phone]        The results of significant diagnostics from this hospitalization (including imaging, microbiology, ancillary and laboratory) are listed below for reference.    Significant Diagnostic Studies: Dg Chest 2 View  06/28/2012  *RADIOLOGY REPORT*  Clinical Data: Chest pain  CHEST - 2 VIEW  Comparison: 09/27/2011 CT  Findings: Cardiomediastinal contours within normal range. Hypoaeration and mild interstitial crowding. Minimal retrocardiac linear opacity.  No confluent airspace opacity.  No pleural effusion or pneumothorax.  No acute osseous finding.  IMPRESSION: Hypoaeration with mild interstitial crowding and retrocardiac atelectasis   Original Report Authenticated By: Jearld Lesch, M.D.    Ct Head Wo Contrast  06/29/2012  *RADIOLOGY REPORT*  Clinical Data: Chest pain, dizzy  CT HEAD WITHOUT CONTRAST  Technique:  Contiguous axial images were obtained from the base of the skull through the vertex without contrast.  Comparison: None.  Findings: No acute intracranial hemorrhage, acute infarction, mass lesion, mass effect, hydrocephalus or midline shift.  Normal gray- white interface throughout.  No cerebral edema.  Globes and orbits are intact.  No focal calvarial or soft tissue abnormality.  Normal aeration of the mastoid air cells and paranasal sinuses.  IMPRESSION: Negative head CT.   Original Report Authenticated By: Malachy Moan, M.D.    Mri Brain Without Contrast  06/29/2012  *RADIOLOGY REPORT*  Clinical Data: Severe headache.  Left arm weakness  MRI HEAD WITHOUT CONTRAST  Technique:  Multiplanar, multiecho pulse sequences of the brain and surrounding structures were obtained according to standard protocol without intravenous contrast.  Comparison: CT head 06/29/2012  Findings: Negative for acute or chronic infarction.  Cerebral white matter  is normal.  Negative for multiple sclerosis.  Brainstem and cerebellum are normal.  Negative for mass or edema.  Negative for intracranial hemorrhage. Negative for fluid collection.  No shift of the midline structures. Mild mucosal edema in the frontal ethmoid sinus in the midline.  IMPRESSION: Negative   Original Report Authenticated By: Janeece Riggers, M.D.     Microbiology: No results found for this or any previous visit (from the past 240 hour(s)).   Labs: Basic Metabolic Panel:  Recent Labs Lab 06/28/12 2240 06/29/12 0907  NA 137  --   K 3.8  --   CL 103  --   CO2 24  --   GLUCOSE 100*  --   BUN 7  --   CREATININE 0.83 0.86  CALCIUM 8.8  --    Liver Function Tests:  Recent Labs Lab 06/28/12 2240  AST 24  ALT 34  ALKPHOS 50  BILITOT 0.2*  PROT 7.4  ALBUMIN 3.8   No results found for this basename: LIPASE, AMYLASE,  in the last 168 hours No results found for this basename: AMMONIA,  in the last 168 hours CBC:  Recent Labs Lab 06/28/12 2240 06/29/12 0907  WBC 6.7 6.9  NEUTROABS 2.0  --   HGB 14.2 13.9  HCT 41.8 40.7  MCV 83.4 83.2  PLT 265 218   Cardiac Enzymes:  Recent Labs Lab 06/29/12 0633  TROPONINI <0.30   BNP: BNP (last 3 results) No results found for this basename: PROBNP,  in the last 8760 hours CBG:  Recent Labs Lab 06/29/12 0010 06/29/12 0924 06/29/12 1135 06/29/12 1657 06/29/12 2229  GLUCAP 101* 120* 100* 126* 99    Signed:  FELIZ ORTIZ, ABRAHAM  [facility] 06/30/2012, 1:06 PM

## 2012-07-01 NOTE — Care Management Note (Signed)
    Page 1 of 1   07/01/2012     8:33:02 AM   CARE MANAGEMENT NOTE 07/01/2012  Patient:  Harold Cohen,Harold Cohen   Account Number:  [id number]  Date Initiated:  07/01/2012  Documentation initiated by:  [facility]  Subjective/Objective Assessment:   admitted with severe headache     Action/Plan:   return home   Anticipated DC Date:     Anticipated DC Plan:        DC Planning Services  CM consult      Choice offered to / List presented to:             Status of service:  Completed, signed off Medicare Important Message given?   (If response is "NO", the following Medicare IM given date fields will be blank) Date Medicare IM given:   Date Additional Medicare IM given:    Discharge Disposition:  HOME/SELF CARE  Per UR Regulation:  Reviewed for med. necessity/level of care/duration of stay  If discussed at Long Length of Stay Meetings, dates discussed:    Comments:

## 2012-07-05 ENCOUNTER — Encounter (HOSPITAL_COMMUNITY): Payer: Self-pay | Admitting: Cardiology

## 2012-07-05 ENCOUNTER — Emergency Department (HOSPITAL_COMMUNITY)
Admission: EM | Admit: 2012-07-05 | Discharge: 2012-07-05 | Disposition: A | Discharge status: Home / Self Care | Attending: Emergency Medicine | Admitting: Emergency Medicine

## 2012-07-05 ENCOUNTER — Emergency Department (HOSPITAL_COMMUNITY)

## 2012-07-05 DIAGNOSIS — J189 Pneumonia, unspecified organism: Secondary | ICD-10-CM | POA: Insufficient documentation

## 2012-07-05 DIAGNOSIS — Z79899 Other long term (current) drug therapy: Secondary | ICD-10-CM | POA: Insufficient documentation

## 2012-07-05 DIAGNOSIS — Z86711 Personal history of pulmonary embolism: Secondary | ICD-10-CM | POA: Insufficient documentation

## 2012-07-05 DIAGNOSIS — E785 Hyperlipidemia, unspecified: Secondary | ICD-10-CM | POA: Insufficient documentation

## 2012-07-05 DIAGNOSIS — F329 Major depressive disorder, single episode, unspecified: Secondary | ICD-10-CM | POA: Insufficient documentation

## 2012-07-05 DIAGNOSIS — I10 Essential (primary) hypertension: Secondary | ICD-10-CM | POA: Insufficient documentation

## 2012-07-05 DIAGNOSIS — Z8679 Personal history of other diseases of the circulatory system: Secondary | ICD-10-CM | POA: Insufficient documentation

## 2012-07-05 DIAGNOSIS — F4310 Post-traumatic stress disorder, unspecified: Secondary | ICD-10-CM | POA: Insufficient documentation

## 2012-07-05 DIAGNOSIS — Z8709 Personal history of other diseases of the respiratory system: Secondary | ICD-10-CM | POA: Insufficient documentation

## 2012-07-05 DIAGNOSIS — K219 Gastro-esophageal reflux disease without esophagitis: Secondary | ICD-10-CM | POA: Insufficient documentation

## 2012-07-05 DIAGNOSIS — R05 Cough: Secondary | ICD-10-CM | POA: Insufficient documentation

## 2012-07-05 DIAGNOSIS — R011 Cardiac murmur, unspecified: Secondary | ICD-10-CM | POA: Insufficient documentation

## 2012-07-05 DIAGNOSIS — F3289 Other specified depressive episodes: Secondary | ICD-10-CM | POA: Insufficient documentation

## 2012-07-05 DIAGNOSIS — R059 Cough, unspecified: Secondary | ICD-10-CM | POA: Insufficient documentation

## 2012-07-05 DIAGNOSIS — Z862 Personal history of diseases of the blood and blood-forming organs and certain disorders involving the immune mechanism: Secondary | ICD-10-CM | POA: Insufficient documentation

## 2012-07-05 DIAGNOSIS — R509 Fever, unspecified: Secondary | ICD-10-CM | POA: Insufficient documentation

## 2012-07-05 LAB — BASIC METABOLIC PANEL
Chloride: 104 mEq/L (ref 96–112)
Creatinine, Ser: 0.95 mg/dL (ref 0.50–1.35)
GFR calc Af Amer: >90 mL/min (ref >90)
Potassium: 3.7 mEq/L (ref 3.5–5.1)

## 2012-07-05 LAB — CBC
Platelets: 218 10*3/uL (ref 150–400)
RDW: 12.9 % (ref 11.5–15.5)
WBC: 3.9 10*3/uL — ABNORMAL LOW (ref 4.0–10.5)

## 2012-07-05 LAB — POCT I-STAT TROPONIN I: Troponin i, poc: 0 ng/mL (ref 0.00–0.08)

## 2012-07-05 MED ORDER — HYDROMORPHONE HCL PF 1 MG/ML IJ SOLN
1.0000 mg | Freq: Once | INTRAMUSCULAR | Status: AC
  Administered 2012-07-05: 1 mg via INTRAVENOUS
  Filled 2012-07-05: qty 1

## 2012-07-05 MED ORDER — AZITHROMYCIN 250 MG PO TABS: ORAL_TABLET | ORAL | Status: DC

## 2012-07-05 MED ORDER — OXYCODONE-ACETAMINOPHEN 5-325 MG PO TABS: 1.0000 | ORAL_TABLET | Freq: Four times a day (QID) | ORAL | Status: DC | PRN

## 2012-07-05 MED ORDER — DEXTROSE 5 % IV SOLN
1.0000 g | Freq: Once | INTRAVENOUS | Status: AC
  Administered 2012-07-05: 1 g via INTRAVENOUS
  Filled 2012-07-05: qty 10

## 2012-07-05 MED ORDER — ONDANSETRON HCL 4 MG/2ML IJ SOLN
4.0000 mg | Freq: Once | INTRAMUSCULAR | Status: AC
  Administered 2012-07-05: 4 mg via INTRAVENOUS
  Filled 2012-07-05: qty 2

## 2012-07-05 MED ORDER — MORPHINE SULFATE 4 MG/ML IJ SOLN
4.0000 mg | Freq: Once | INTRAMUSCULAR | Status: AC
  Administered 2012-07-05: 4 mg via INTRAVENOUS
  Filled 2012-07-05: qty 1

## 2012-07-05 NOTE — ED Notes (Signed)
Pt reports he was here in the hospital about a week ago for the same symptoms of chest pain and feeling like his heart is racing. States he has coughed up some mucous with blood noted in it. States he feels like his heart has been racing. C/o SOB.

## 2012-07-05 NOTE — ED Notes (Addendum)
Patient is resting in room.  He states that he is experiencing a headache, and feels weak.  He said he was seen for this at [facility] last week for the same symptoms.  Lung sounds are slightly diminished and he states his pain is an 8/10.

## 2012-07-05 NOTE — ED Provider Notes (Signed)
History     CSN: [id number]  Arrival date & time 07/05/12  1542   First MD Initiated Contact with Patient 07/05/12 1553      Chief Complaint  Patient presents with  . Chest Pain    HPI Yisroel Mullendore is a 32 y.o. male who was brought in to the ED for concern of chest pain.  L sided.  Worse with deep breathing and cough.  Sharp.  Moderate in severity.  Had small amount of blood tinged mucous last night.  Recent cough and chills.  Pain started about 10 days ago.  Has history of bilateral PEs and was taken of coumadin in 2012.  No PEs since then.  No recent leg swelling.  No leg pain.  Pain nonexertional.    Past Medical History  Diagnosis Date  . Pulmonary embolism   . Pleural effusion   . PTSD (post-traumatic stress disorder)   . Migraines   . Depression   . Anxiety   . Insomnia   . Clotting disorder   . Heart murmur   . Hyperlipidemia   . Hypertension   . Shortness of breath   . GERD (gastroesophageal reflux disease)     History reviewed. No pertinent past surgical history.  Family History  Problem Relation Age of Onset  . Coronary artery disease      History  Substance Use Topics  . Smoking status: Never Smoker   . Smokeless tobacco: Never Used  . Alcohol Use: 1.2 oz/week    2 Glasses of wine per week     Comment: DAILY      Review of Systems  Constitutional: Positive for fever and chills.  HENT: Negative for congestion, sore throat and neck pain.   Respiratory: Positive for cough.   Cardiovascular: Positive for chest pain.  Gastrointestinal: Negative for nausea, vomiting, abdominal pain, diarrhea and constipation.  Endocrine: Negative for polyuria.  Genitourinary: Negative for dysuria and hematuria.  Skin: Negative for rash.  Neurological: Negative for headaches.  Psychiatric/Behavioral: Negative.   All other systems reviewed and are negative.    Allergies  Review of patient's allergies indicates no known allergies.  Home Medications   Current  Outpatient Rx  Name  Route  Sig  Dispense  Refill  . atorvastatin (LIPITOR) 20 MG tablet   Oral   Take 20 mg by mouth daily.         . betamethasone dipropionate (DIPROLENE) 0.05 % cream   Topical   Apply topically 4 (four) times daily.         . citalopram (CELEXA) 20 MG tablet   Oral   Take 2 tablets (40 mg total) by mouth daily.   30 tablet   0   . divalproex (DEPAKOTE ER) 250 MG 24 hr tablet   Oral   Take 1 tablet (250 mg total) by mouth daily.   30 tablet   0   . ergocalciferol (VITAMIN D2) 50000 UNITS capsule   Oral   Take 50,000 Units by mouth 2 (two) times a week. Taken on Tuesdays and Thursdays.         Marland Kitchen ibuprofen (ADVIL,MOTRIN) 200 MG tablet   Oral   Take 200 mg by mouth every 6 (six) hours as needed. For pain         . lidocaine (XYLOCAINE) 2 % jelly   Topical   Apply 1 application topically as needed. For pain         . metoCLOPramide (REGLAN) 5 MG tablet  Oral   Take 1 tablet (5 mg total) by mouth 3 (three) times daily as needed.   15 tablet   0   . metoprolol (LOPRESSOR) 100 MG tablet   Oral   Take 100 mg by mouth 2 (two) times daily.         . Multiple Vitamin (MULTIVITAMIN) tablet   Oral   Take 2 tablets by mouth daily.         Marland Kitchen omeprazole (PRILOSEC) 40 MG capsule   Oral   Take 40 mg by mouth daily.         . prazosin (MINIPRESS) 2 MG capsule   Oral   Take 8 mg by mouth at bedtime.         Marland Kitchen QUEtiapine (SEROQUEL) 25 MG tablet   Oral   Take 2 tablets (50 mg total) by mouth at bedtime.   30 tablet   0     BP 141/79  Pulse 127  Temp(Src) 98.5 F (36.9 C) (Oral)  Resp 18  SpO2 98%  Physical Exam  Nursing note and vitals reviewed. Constitutional: He is oriented to person, place, and time. He appears well-developed and well-nourished. No distress.  HENT:  Head: Normocephalic and atraumatic.  Right Ear: External ear normal.  Left Ear: External ear normal.  Mouth/Throat: Oropharynx is clear and moist. No  oropharyngeal exudate.  Eyes: Conjunctivae are normal. Pupils are equal, round, and reactive to light. Right eye exhibits no discharge.  Neck: Normal range of motion. Neck supple. No tracheal deviation present.  Cardiovascular: Regular rhythm and intact distal pulses.  Tachycardia present.   Pulmonary/Chest: Effort normal. No respiratory distress. He has no wheezes. He has no rales.  Abdominal: Soft. He exhibits no distension. There is no tenderness. There is no rebound and no guarding.  Musculoskeletal: Normal range of motion.  Neurological: He is alert and oriented to person, place, and time.  Skin: Skin is warm and dry. No rash noted. He is not diaphoretic.  Psychiatric: He has a normal mood and affect.    ED Course  Procedures (including critical care time)  Labs Reviewed  CBC  BASIC METABOLIC PANEL   Dg Chest 2 View  07/05/2012  *RADIOLOGY REPORT*  Clinical Data: Chest pain  CHEST - 2 VIEW  Comparison: Prior chest x-ray 06/28/2012  Findings: Patchy opacity in the right middle lobe partially obscures the right cardiac margin.  Otherwise, stable cardiac and mediastinal contours.  No pneumothorax or pleural effusion.  No acute osseous abnormality.  IMPRESSION: Patchy opacity in the right middle lobe concerning for infiltrate/pneumonia in the appropriate clinical setting.   Original Report Authenticated By: Malachy Moan, M.D.      1. Community acquired pneumonia      Date: 07/06/2012  Rate: 129  Rhythm: sinus tachycardia  QRS Axis: normal  Intervals: normal  ST/T Wave abnormalities: normal  Conduction Disutrbances:none  Narrative Interpretation: Sinus tachycardia, otherwise normal EKG.  Old EKG Reviewed: unchanged    MDM  Tatum Massman is a 32 y.o. male who presented to the ED for concern of pleuritic CP and cough.  Patient initially mildly tachycardic.  History of bilateral PEs but no longer on coumadin.  CT PE done and negative.  Concerning for consolidative process.   Will treat with IV Rocephin and outpatient Z-pack.  Patient safe for discharge home.  Pain likely 2/2 pneumonia.  No evidence of ACS.  EKG sinus tach without ischemic changes.   Return precautions given.  Patient discharged.  Arloa Koh, MD 07/06/12 0000

## 2012-07-06 NOTE — ED Provider Notes (Signed)
I saw and evaluated the patient, reviewed the resident's note and I agree with the findings and plan.   .Face to face Exam:  General:  Awake HEENT:  Atraumatic Resp:  Normal effort Abd:  Nondistended Neuro:No focal weakness Lymph: No adenopathy   Nelia Shi, MD 07/06/12 [phone]

## 2012-08-04 ENCOUNTER — Telehealth (INDEPENDENT_AMBULATORY_CARE_PROVIDER_SITE_OTHER): Payer: Self-pay | Admitting: *Deleted

## 2012-08-04 NOTE — Telephone Encounter (Signed)
Patient calls to report that he is having rectal bleeding again.  Patient believes it is his anal fissures again.  Appt scheduled with Dr. Maisie Fus for next Wednesday.

## 2012-08-10 ENCOUNTER — Encounter (INDEPENDENT_AMBULATORY_CARE_PROVIDER_SITE_OTHER): Admitting: General Surgery

## 2013-03-17 ENCOUNTER — Ambulatory Visit: Admitting: Internal Medicine

## 2013-03-28 ENCOUNTER — Encounter: Payer: Self-pay | Admitting: Internal Medicine

## 2013-03-28 ENCOUNTER — Ambulatory Visit (INDEPENDENT_AMBULATORY_CARE_PROVIDER_SITE_OTHER): Admitting: Internal Medicine

## 2013-03-28 VITALS — BP 120/68 | HR 109 | Ht 71.0 in | Wt 208.0 lb

## 2013-03-28 DIAGNOSIS — E785 Hyperlipidemia, unspecified: Secondary | ICD-10-CM

## 2013-03-28 DIAGNOSIS — R079 Chest pain, unspecified: Secondary | ICD-10-CM

## 2013-03-28 DIAGNOSIS — R000 Tachycardia, unspecified: Secondary | ICD-10-CM

## 2013-03-28 DIAGNOSIS — I498 Other specified cardiac arrhythmias: Secondary | ICD-10-CM

## 2013-03-28 DIAGNOSIS — I2699 Other pulmonary embolism without acute cor pulmonale: Secondary | ICD-10-CM

## 2013-03-28 DIAGNOSIS — F4310 Post-traumatic stress disorder, unspecified: Secondary | ICD-10-CM

## 2013-03-28 DIAGNOSIS — I10 Essential (primary) hypertension: Secondary | ICD-10-CM

## 2013-03-28 NOTE — Patient Instructions (Signed)
Dr. Rennis Golden has referred you to Dr. Aron Baba at [facility].   Your physician recommends that you schedule a follow-up appointment with Dr. Rennis Golden as needed.

## 2013-03-28 NOTE — Progress Notes (Signed)
OFFICE NOTE  Chief Complaint:  Tachycardia, chest wall pain, fatigue  Primary Care Physician: Gwynneth Aliment, MD  HPI:  Harold Cohen is a pleasant 32 year old male who is a war veteran. He gets his care at the [facility] in [place]. He's been unfortunately to the hospital a number of times recently for chest pain, tachycardia and fatigue. His past medical history includes bilateral pulmonary emboli of unknown etiology. He tested positive for factor V Leiden, however it is unclear if this was during anticoagulation or not. He saw a hematologist in the past who felt that he did not need lifelong anticoagulation and currently he is no longer on any blood thinners. With regards to his tachycardia is reported persistent tachycardia with variable rates, some associated anxiety and fatigue. He does have posttraumatic stress disorder and a 70% service connected at the [facility]. He saw a cardiologist (Dr. Cloria Spring) in [place] at [facility] in 2013, after an echocardiogram demonstrated an elevated LVOT gradient, which was concerning for possible hokum. Subsequently was felt that this was actually an intracavitary gradient T2-1 hyperdynamic ventricle. He also underwent a stress echocardiogram in 2013 which was negative for ischemic induced wall motion abnormalities.  A cardiac MRI was scheduled however not performed due to it not being covered by the [facility].  He is been on a number of treatments including medications for his PTSD, neuropathic pain medicines, and selective beta blockers which have not managed to help with his tachycardia. He reports symptoms of chronic fatigue syndrome but does not carry the diagnosis.  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  PMHx:  Past Medical History  Diagnosis Date  . Pulmonary embolism   . Pleural effusion   . PTSD (post-traumatic stress disorder)   . Migraines   . Depression   . Anxiety   . Insomnia    . Clotting disorder   . Heart murmur   . Hyperlipidemia   . Hypertension   . Shortness of breath   . GERD (gastroesophageal reflux disease)     History reviewed. No pertinent past surgical history.  FAMHx:  Family History  Problem Relation Age of Onset  . Coronary artery disease      SOCHx:   reports that he has never smoked. He has never used smokeless tobacco. He reports that he drinks about 1.2 ounces of alcohol per week. He reports that he does not use illicit drugs.  ALLERGIES:  No Known Allergies  ROS: A comprehensive review of systems was negative except for: Constitutional: positive for fatigue Respiratory: positive for pleurisy/chest pain Cardiovascular: positive for irregular heart beat, palpitations and tachycardia  HOME MEDS: Current Outpatient Prescriptions  Medication Sig Dispense Refill  . atorvastatin (LIPITOR) 20 MG tablet Take 20 mg by mouth daily.      . betamethasone dipropionate (DIPROLENE) 0.05 % cream Apply topically 4 (four) times daily.      . divalproex (DEPAKOTE ER) 250 MG 24 hr tablet Take 1 tablet (250 mg total) by mouth daily.  30 tablet  0  . ergocalciferol (VITAMIN D2) 50000 UNITS capsule Take 50,000 Units by mouth 2 (two) times a week. Taken on Tuesdays and Thursdays.      Marland Kitchen HYDROXYZINE PAMOATE PO Take 50 mg by mouth daily.      Marland Kitchen lidocaine (XYLOCAINE) 2 % jelly Apply 1 application topically as needed. For pain      . metoCLOPramide (REGLAN) 5 MG tablet Take 1 tablet (5  mg total) by mouth 3 (three) times daily as needed.  15 tablet  0  . metoprolol (LOPRESSOR) 100 MG tablet Take 50 mg by mouth 2 (two) times daily.       . Multiple Vitamin (MULTIVITAMIN) tablet Take 2 tablets by mouth daily.      Marland Kitchen omeprazole (PRILOSEC) 40 MG capsule Take 40 mg by mouth daily.      . prazosin (MINIPRESS) 2 MG capsule Take 8 mg by mouth at bedtime.      Marland Kitchen QUEtiapine (SEROQUEL) 25 MG tablet Take 2 tablets (50 mg total) by mouth at bedtime.  30 tablet  0  .  traZODone (DESYREL) 100 MG tablet Take 100 mg by mouth at bedtime.      . Venlafaxine HCl 150 MG TB24 Take 1 tablet by mouth daily.       No current facility-administered medications for this visit.    LABS/IMAGING: No results found for this or any previous visit (from the past 48 hour(s)). No results found.  VITALS: BP 120/68  Pulse 109  Ht 5\' 11"  (1.803 m)  Wt 208 lb (94.348 kg)  BMI 29.02 kg/m2  EXAM: General appearance: alert and no distress Neck: no carotid bruit and no JVD Lungs: clear to auscultation bilaterally Heart: regular rate and rhythm, S1, S2 normal, no murmur, click, rub or gallop Abdomen: soft, non-tender; bowel sounds normal; no masses,  no organomegaly Extremities: extremities normal, atraumatic, no cyanosis or edema Pulses: 2+ and symmetric Skin: Skin color, texture, turgor normal. No rashes or lesions Neurologic: Grossly normal Psych: Mood, affect normal  EKG: Sinus tachycardia 109, no ischemic changes, normal axes, intervals  ASSESSMENT: 1. 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 PTSD 3. Atypical chest wall pain  PLAN: 1.   Mr. Edgell has inappropriate sinus tachycardia and some positional dizziness without true postural hypotension. I suspect this is on the spectrum of pots disorders, which is not unusual in patients returning from war with posttraumatic stress. Unfortunately, he has not responded to typical treatments for PTSD.  He continues to have inappropriate tachycardia and he was clearly notable during auscultation, his heart rate varied quite rapidly between a regular sinus tachycardia and regular sinus rhythm.  I suspect he may do better with a nonselective beta blocker.  He could benefit from a specialty center for further evaluation especially given the ongoing palpitations and lack of improvement with his care so far.  I would recommend referral to Dr. Aron Baba at [facility], who does have some interest  and experience in treating POTS syndrome and related disorders.  Follow-up with me prn.  Chrystie Nose, MD, [facility] Attending Cardiologist [facility]  HILTY,Kenneth C 03/28/2013, 4:20 PM

## 2013-03-29 ENCOUNTER — Encounter: Payer: Self-pay | Admitting: Internal Medicine

## 2013-05-16 ENCOUNTER — Telehealth: Payer: Self-pay | Admitting: Internal Medicine

## 2013-05-16 NOTE — Telephone Encounter (Signed)
Per Colin InaKim Cannan --[facility]

## 2013-05-17 ENCOUNTER — Emergency Department (HOSPITAL_COMMUNITY)
Admission: EM | Admit: 2013-05-17 | Discharge: 2013-05-17 | Disposition: A | Discharge status: Home / Self Care | Attending: Emergency Medicine | Admitting: Emergency Medicine

## 2013-05-17 ENCOUNTER — Emergency Department (HOSPITAL_COMMUNITY)

## 2013-05-17 ENCOUNTER — Encounter (HOSPITAL_COMMUNITY): Payer: Self-pay | Admitting: Emergency Medicine

## 2013-05-17 DIAGNOSIS — R0602 Shortness of breath: Secondary | ICD-10-CM | POA: Insufficient documentation

## 2013-05-17 DIAGNOSIS — F4310 Post-traumatic stress disorder, unspecified: Secondary | ICD-10-CM | POA: Insufficient documentation

## 2013-05-17 DIAGNOSIS — R011 Cardiac murmur, unspecified: Secondary | ICD-10-CM | POA: Insufficient documentation

## 2013-05-17 DIAGNOSIS — E785 Hyperlipidemia, unspecified: Secondary | ICD-10-CM | POA: Insufficient documentation

## 2013-05-17 DIAGNOSIS — I10 Essential (primary) hypertension: Secondary | ICD-10-CM | POA: Insufficient documentation

## 2013-05-17 DIAGNOSIS — R079 Chest pain, unspecified: Secondary | ICD-10-CM

## 2013-05-17 DIAGNOSIS — Z79899 Other long term (current) drug therapy: Secondary | ICD-10-CM | POA: Insufficient documentation

## 2013-05-17 DIAGNOSIS — R071 Chest pain on breathing: Secondary | ICD-10-CM | POA: Insufficient documentation

## 2013-05-17 DIAGNOSIS — R109 Unspecified abdominal pain: Secondary | ICD-10-CM | POA: Insufficient documentation

## 2013-05-17 DIAGNOSIS — R0789 Other chest pain: Secondary | ICD-10-CM

## 2013-05-17 DIAGNOSIS — R059 Cough, unspecified: Secondary | ICD-10-CM | POA: Insufficient documentation

## 2013-05-17 DIAGNOSIS — Z8719 Personal history of other diseases of the digestive system: Secondary | ICD-10-CM | POA: Insufficient documentation

## 2013-05-17 DIAGNOSIS — Z862 Personal history of diseases of the blood and blood-forming organs and certain disorders involving the immune mechanism: Secondary | ICD-10-CM | POA: Insufficient documentation

## 2013-05-17 DIAGNOSIS — F3289 Other specified depressive episodes: Secondary | ICD-10-CM | POA: Insufficient documentation

## 2013-05-17 DIAGNOSIS — F411 Generalized anxiety disorder: Secondary | ICD-10-CM | POA: Insufficient documentation

## 2013-05-17 DIAGNOSIS — R05 Cough: Secondary | ICD-10-CM | POA: Insufficient documentation

## 2013-05-17 DIAGNOSIS — F329 Major depressive disorder, single episode, unspecified: Secondary | ICD-10-CM | POA: Insufficient documentation

## 2013-05-17 DIAGNOSIS — Z86711 Personal history of pulmonary embolism: Secondary | ICD-10-CM | POA: Insufficient documentation

## 2013-05-17 LAB — CBC
HCT: 47.8 % (ref 39.0–52.0)
HEMOGLOBIN: 16.6 g/dL (ref 13.0–17.0)
MCH: 29.1 pg (ref 26.0–34.0)
MCHC: 34.7 g/dL (ref 30.0–36.0)
MCV: 83.7 fL (ref 78.0–100.0)
Platelets: 275 10*3/uL (ref 150–400)
RBC: 5.71 MIL/uL (ref 4.22–5.81)
RDW: 13.5 % (ref 11.5–15.5)
WBC: 7.3 10*3/uL (ref 4.0–10.5)

## 2013-05-17 LAB — PRO B NATRIURETIC PEPTIDE: Pro B Natriuretic peptide (BNP): <5 pg/mL (ref 0–125)

## 2013-05-17 LAB — BASIC METABOLIC PANEL
BUN: 14 mg/dL (ref 6–23)
CALCIUM: 9.6 mg/dL (ref 8.4–10.5)
CO2: 22 mEq/L (ref 19–32)
Chloride: 100 mEq/L (ref 96–112)
Creatinine, Ser: 0.86 mg/dL (ref 0.50–1.35)
GLUCOSE: 102 mg/dL — AB (ref 70–99)
POTASSIUM: 4.3 meq/L (ref 3.7–5.3)
SODIUM: 137 meq/L (ref 137–147)

## 2013-05-17 LAB — POCT I-STAT TROPONIN I: TROPONIN I, POC: 0.03 ng/mL (ref 0.00–0.08)

## 2013-05-17 LAB — TROPONIN I: Troponin I: <0.3 ng/mL (ref <0.30)

## 2013-05-17 LAB — D-DIMER, QUANTITATIVE: D-Dimer, Quant: <0.27 ug/mL-FEU (ref 0.00–0.48)

## 2013-05-17 MED ORDER — ONDANSETRON HCL 4 MG/2ML IJ SOLN
4.0000 mg | Freq: Once | INTRAMUSCULAR | Status: AC
  Administered 2013-05-17: 4 mg via INTRAVENOUS
  Filled 2013-05-17: qty 2

## 2013-05-17 MED ORDER — LORAZEPAM 2 MG/ML IJ SOLN
1.0000 mg | Freq: Once | INTRAMUSCULAR | Status: AC
  Administered 2013-05-17: 1 mg via INTRAVENOUS
  Filled 2013-05-17: qty 1

## 2013-05-17 MED ORDER — IBUPROFEN 600 MG PO TABS: 600.0000 mg | ORAL_TABLET | Freq: Three times a day (TID) | ORAL | Status: AC | PRN

## 2013-05-17 MED ORDER — HYDROMORPHONE HCL PF 1 MG/ML IJ SOLN
1.0000 mg | Freq: Once | INTRAMUSCULAR | Status: AC
Start: 2013-05-17 — End: 2013-05-17
  Administered 2013-05-17: 1 mg via INTRAVENOUS
  Filled 2013-05-17: qty 1

## 2013-05-17 MED ORDER — HYDROCODONE-ACETAMINOPHEN 5-325 MG PO TABS: 2.0000 | ORAL_TABLET | Freq: Once | ORAL | Status: DC

## 2013-05-17 NOTE — Discharge Instructions (Signed)
Take motrin as need for pain. Follow up with primary care doctor in the next couple days for recheck. Also have your blood pressure rechecked then as it is high today.  Also follow up with your cardiologist in 1 week. Return to ER right away if worse, new symptoms, fevers, trouble breathing, persistent/recurrent chest pain, other concern.  You were given pain medication in the ER that causes drowsiness, no driving for the next 6 hours.      Chest Wall Pain Chest wall pain is pain in or around the bones and muscles of your chest. It may take up to 6 weeks to get better. It may take longer if you must stay physically active in your work and activities.  CAUSES  Chest wall pain may happen on its own. However, it may be caused by:  A viral illness like the flu.  Injury.  Coughing.  Exercise.  Arthritis.  Fibromyalgia.  Shingles. HOME CARE INSTRUCTIONS   Avoid overtiring physical activity. Try not to strain or perform activities that cause pain. This includes any activities using your chest or your abdominal and side muscles, especially if heavy weights are used.  Put ice on the sore area.  Put ice in a plastic bag.  Place a towel between your skin and the bag.  Leave the ice on for 15-20 minutes per hour while awake for the first 2 days.  Only take over-the-counter or prescription medicines for pain, discomfort, or fever as directed by your caregiver. SEEK IMMEDIATE MEDICAL CARE IF:   Your pain increases, or you are very uncomfortable.  You have a fever.  Your chest pain becomes worse.  You have new, unexplained symptoms.  You have nausea or vomiting.  You feel sweaty or lightheaded.  You have a cough with phlegm (sputum), or you cough up blood. MAKE SURE YOU:   Understand these instructions.  Will watch your condition.  Will get help right away if you are not doing well or get worse. Document Released: 04/27/2005 Document Revised: 07/20/2011 Document  Reviewed: 12/22/2010 [facility] Patient Information 2014 [place].     Chest Pain (Nonspecific) It is often hard to give a specific diagnosis for the cause of chest pain. There is always a chance that your pain could be related to something serious, such as a heart attack or a blood clot in the lungs. You need to follow up with your caregiver for further evaluation. CAUSES   Heartburn.  Pneumonia or bronchitis.  Anxiety or stress.  Inflammation around your heart (pericarditis) or lung (pleuritis or pleurisy).  A blood clot in the lung.  A collapsed lung (pneumothorax). It can develop suddenly on its own (spontaneous pneumothorax) or from injury (trauma) to the chest.  Shingles infection (herpes zoster virus). The chest wall is composed of bones, muscles, and cartilage. Any of these can be the source of the pain.  The bones can be bruised by injury.  The muscles or cartilage can be strained by coughing or overwork.  The cartilage can be affected by inflammation and become sore (costochondritis). DIAGNOSIS  Lab tests or other studies, such as X-rays, electrocardiography, stress testing, or cardiac imaging, may be needed to find the cause of your pain.  TREATMENT   Treatment depends on what may be causing your chest pain. Treatment may include:  Acid blockers for heartburn.  Anti-inflammatory medicine.  Pain medicine for inflammatory conditions.  Antibiotics if an infection is present.  You may be advised to change lifestyle habits. This  includes stopping smoking and avoiding alcohol, caffeine, and chocolate.  You may be advised to keep your head raised (elevated) when sleeping. This reduces the chance of acid going backward from your stomach into your esophagus.  Most of the time, nonspecific chest pain will improve within 2 to 3 days with rest and mild pain medicine. HOME CARE INSTRUCTIONS   If antibiotics were prescribed, take your antibiotics as directed.  Finish them even if you start to feel better.  For the next few days, avoid physical activities that bring on chest pain. Continue physical activities as directed.  Do not smoke.  Avoid drinking alcohol.  Only take over-the-counter or prescription medicine for pain, discomfort, or fever as directed by your caregiver.  Follow your caregiver's suggestions for further testing if your chest pain does not go away.  Keep any follow-up appointments you made. If you do not go to an appointment, you could develop lasting (chronic) problems with pain. If there is any problem keeping an appointment, you must call to reschedule. SEEK MEDICAL CARE IF:   You think you are having problems from the medicine you are taking. Read your medicine instructions carefully.  Your chest pain does not go away, even after treatment.  You develop a rash with blisters on your chest. SEEK IMMEDIATE MEDICAL CARE IF:   You have increased chest pain or pain that spreads to your arm, neck, jaw, back, or abdomen.  You develop shortness of breath, an increasing cough, or you are coughing up blood.  You have severe back or abdominal pain, feel nauseous, or vomit.  You develop severe weakness, fainting, or chills.  You have a fever. THIS IS AN EMERGENCY. Do not wait to see if the pain will go away. Get medical help at once. Call your local emergency services (911 in [place] Do not drive yourself to the hospital. MAKE SURE YOU:   Understand these instructions.  Will watch your condition.  Will get help right away if you are not doing well or get worse. Document Released: 02/04/2005 Document Revised: 07/20/2011 Document Reviewed: 12/01/2007 [facility] Patient Information 2014 [place], [place].   Costochondritis Costochondritis (Tietze syndrome), or costochondral separation, is a swelling and irritation (inflammation) of the tissue (cartilage) that connects your ribs with your breastbone (sternum). It may occur on its  own (spontaneously), through damage caused by an accident (trauma), or simply from coughing or minor exercise. It may take up to 6 weeks to get better and longer if you are unable to be conservative in your activities. HOME CARE INSTRUCTIONS   Avoid exhausting physical activity. Try not to strain your ribs during normal activity. This would include any activities using chest, belly (abdominal), and side muscles, especially if heavy weights are used.  Use ice for 15-20 minutes per hour while awake for the first 2 days. Place the ice in a plastic bag, and place a towel between the bag of ice and your skin.  Only take over-the-counter or prescription medicines for pain, discomfort, or fever as directed by your caregiver. SEEK IMMEDIATE MEDICAL CARE IF:   Your pain increases or you are very uncomfortable.  You have a fever.  You develop difficulty with your breathing.  You cough up blood.  You develop worse chest pains, shortness of breath, sweating, or vomiting.  You develop new, unexplained problems (symptoms). MAKE SURE YOU:   Understand these instructions.  Will watch your condition.  Will get help right away if you are not doing well or get worse.  Document Released: 02/04/2005 Document Revised: 07/20/2011 Document Reviewed: 11/29/2012 [facility] Patient Information 2014 [place], [place].

## 2013-05-17 NOTE — ED Notes (Signed)
Pt c/o "low rib pain" on both sides x 1 wk.  States that he has been increasingly SOB.  Hx of PE.

## 2013-05-17 NOTE — ED Notes (Signed)
Complaining of chest pain 10/10 after receiving dilaudid. MD steinl made aware. Ativan given with positive effect

## 2013-05-17 NOTE — ED Notes (Signed)
History of PE.  Sats checked at [facility]  97%.

## 2013-05-17 NOTE — ED Provider Notes (Signed)
CSN: [id number]     Arrival date & time 05/17/13  1157 History   First MD Initiated Contact with Patient 05/17/13 1250     Chief Complaint  Patient presents with  . Shortness of Breath  . Chest Pain  . Abdominal Pain   (Consider location/radiation/quality/duration/timing/severity/associated sxs/prior Treatment) Patient is a 33 y.o. male presenting with shortness of breath, chest pain, and abdominal pain. The history is provided by the patient.  Shortness of Breath Associated symptoms: abdominal pain, chest pain and cough   Associated symptoms: no fever, no headaches, no neck pain, no rash, no sore throat and no vomiting   Chest Pain Associated symptoms: abdominal pain and cough   Associated symptoms: no back pain, no fever, no headache, no nausea, no palpitations and not vomiting   Abdominal Pain Associated symptoms: chest pain and cough   Associated symptoms: no chills, no fever, no nausea, no sore throat and no vomiting   pt c/o bil cp for past week. Constant. Dull. Moderate. Occasionally worse w episodic non prod cough.  No hemoptysis.  Denies sore throat, runny nose or body aches. No fever or chills. Pt notes remote hx pe, unsure of cause, states off blood thinners x years. No known ill contacts. Denies chest wall strain. Symptoms constant x 1 week. No episodic or exertional cp. No unusual doe. No associated nv, or diaphoresis. No fam hx cad/premature cad. Denies any recent trauma, immobility, surgery. No leg pain or swelling. Non smoker. No cocaine/drug use.      Past Medical History  Diagnosis Date  . Pulmonary embolism   . Pleural effusion   . PTSD (post-traumatic stress disorder)   . Migraines   . Depression   . Anxiety   . Insomnia   . Clotting disorder   . Heart murmur   . Hyperlipidemia   . Hypertension   . Shortness of breath   . GERD (gastroesophageal reflux disease)    History reviewed. No pertinent past surgical history. Family History  Problem Relation Age of  Onset  . Coronary artery disease     History  Substance Use Topics  . Smoking status: Never Smoker   . Smokeless tobacco: Never Used  . Alcohol Use: 1.2 oz/week    2 Glasses of wine per week     Comment: DAILY    Review of Systems  Constitutional: Negative for fever and chills.  HENT: Negative for sore throat.   Eyes: Negative for redness.  Respiratory: Positive for cough.   Cardiovascular: Positive for chest pain. Negative for palpitations and leg swelling.  Gastrointestinal: Positive for abdominal pain. Negative for nausea and vomiting.  Genitourinary: Negative for flank pain.  Musculoskeletal: Negative for back pain and neck pain.  Skin: Negative for rash.  Neurological: Negative for headaches.  Hematological: Does not bruise/bleed easily.  Psychiatric/Behavioral: Negative for confusion.    Allergies  Review of patient's allergies indicates no known allergies.  Home Medications   Current Outpatient Rx  Name  Route  Sig  Dispense  Refill  . atorvastatin (LIPITOR) 20 MG tablet   Oral   Take 20 mg by mouth every evening.          . ergocalciferol (VITAMIN D2) 50000 UNITS capsule   Oral   Take 50,000 Units by mouth 2 (two) times a week. Taken on Tuesdays and Thursdays.         . hydroxypropyl methylcellulose (ISOPTO TEARS) 2.5 % ophthalmic solution   Both Eyes   Place 2 drops  into both eyes as needed for dry eyes.         Marland Kitchen HYDROXYZINE PAMOATE PO   Oral   Take 50 mg by mouth daily.         . metoprolol (LOPRESSOR) 100 MG tablet   Oral   Take 50 mg by mouth 2 (two) times daily.          . Multiple Vitamin (MULTIVITAMIN) tablet   Oral   Take 2 tablets by mouth daily.         . prazosin (MINIPRESS) 2 MG capsule   Oral   Take 8 mg by mouth at bedtime.         Marland Kitchen QUEtiapine (SEROQUEL) 25 MG tablet   Oral   Take 2 tablets (50 mg total) by mouth at bedtime.   30 tablet   0   . traZODone (DESYREL) 100 MG tablet   Oral   Take 100 mg by mouth at  bedtime.         . divalproex (DEPAKOTE ER) 250 MG 24 hr tablet   Oral   Take 1 tablet (250 mg total) by mouth daily.   30 tablet   0    BP 152/95  Pulse 100  Temp(Src) 98.7 F (37.1 C) (Oral)  Resp 18  SpO2 100% Physical Exam  Nursing note and vitals reviewed. Constitutional: He is oriented to person, place, and time. He appears well-developed and well-nourished. No distress.  HENT:  Mouth/Throat: Oropharynx is clear and moist.  Eyes: Conjunctivae are normal. No scleral icterus.  Neck: Neck supple. No JVD present. No tracheal deviation present.  Cardiovascular: Normal rate, regular rhythm, normal heart sounds and intact distal pulses.  Exam reveals no gallop and no friction rub.   No murmur heard. Pulmonary/Chest: Effort normal and breath sounds normal. No accessory muscle usage. No respiratory distress. He exhibits tenderness.  Non prod cough  Abdominal: Soft. He exhibits no distension and no mass. There is no tenderness. There is no rebound and no guarding.  Musculoskeletal: Normal range of motion. He exhibits no edema and no tenderness.  Neurological: He is alert and oriented to person, place, and time.  Skin: Skin is warm and dry. He is not diaphoretic.  Psychiatric: He has a normal mood and affect.    ED Course  Procedures (including critical care time)  Results for orders placed during the hospital encounter of 05/17/13  CBC      Result Value Range   WBC 7.3  4.0 - 10.5 K/uL   RBC 5.71  4.22 - 5.81 MIL/uL   Hemoglobin 16.6  13.0 - 17.0 g/dL   HCT 11.9  14.7 - 82.9 %   MCV 83.7  78.0 - 100.0 fL   MCH 29.1  26.0 - 34.0 pg   MCHC 34.7  30.0 - 36.0 g/dL   RDW 56.2  13.0 - 86.5 %   Platelets 275  150 - 400 K/uL  BASIC METABOLIC PANEL      Result Value Range   Sodium 137  137 - 147 mEq/L   Potassium 4.3  3.7 - 5.3 mEq/L   Chloride 100  96 - 112 mEq/L   CO2 22  19 - 32 mEq/L   Glucose, Bld 102 (*) 70 - 99 mg/dL   BUN 14  6 - 23 mg/dL   Creatinine, Ser 7.84   0.50 - 1.35 mg/dL   Calcium 9.6  8.4 - 69.6 mg/dL   GFR calc non Af Amer >90  >  90 mL/min   GFR calc Af Amer >90  >90 mL/min  PRO B NATRIURETIC PEPTIDE      Result Value Range   Pro B Natriuretic peptide (BNP) <5.0  0 - 125 pg/mL  D-DIMER, QUANTITATIVE      Result Value Range   D-Dimer, Quant <0.27  0.00 - 0.48 ug/mL-FEU  TROPONIN I      Result Value Range   Troponin I <0.30  <0.30 ng/mL  POCT I-STAT TROPONIN I      Result Value Range   Troponin i, poc 0.03  0.00 - 0.08 ng/mL   Comment 3            Dg Chest 2 View  05/17/2013   CLINICAL DATA:  Shortness of breath and chest pain for 1 week, past history of pulmonary emboli, clotting disorder, hypertension, hyperlipidemia, GERD  EXAM: CHEST  2 VIEW  COMPARISON:  07/05/2012  FINDINGS: Normal heart size, mediastinal contours, and pulmonary vascularity.  Lungs clear.  No pleural effusion or pneumothorax.  Bones unremarkable.  IMPRESSION: No acute abnormalities.   Electronically Signed   By: Ulyses Southward M.D.   On: 05/17/2013 13:24      EKG Interpretation    Date/Time:  Wednesday May 17 2013 12:20:27 EST Ventricular Rate:  95 PR Interval:  151 QRS Duration: 80 QT Interval:  340 QTC Calculation: 427 R Axis:   12 Text Interpretation:  Sinus rhythm  Nonspecific T abnormalities, lateral leads Confirmed by Cabrini Ruggieri  MD, Eyden Dobie ([id number]) on 05/17/2013 12:26:42 PM            MDM  Iv ns. Labs. Ecg. Cxr. pts family requests iv pain med, dilaudid 1 mg iv.   Reviewed nursing notes and prior charts for additional history.   Pt w marked chest wall tenderness. Symmetric excursion, no crepitus.   Reviewed prior notes, incl prior card note, stress echo - pt felt to have non cardiac cp. Prior 5 ct angios chest over course of past 3 years, neg for pe.   Pt noted w hx ptsd, anxiety.  Appears anxious in ed. Ativan 1 mg iv.   Recheck hr 84, rr 14, pulse ox 100% room air.  Recheck pt states feels much improved. Is eating and drinking.   After  constant symptoms x days, troponin x 2 normal/neg. ddimer normal.  Pt feels much improved and stable for d/c.       Suzi Roots, MD 05/17/13 1557

## 2014-04-10 ENCOUNTER — Emergency Department (HOSPITAL_BASED_OUTPATIENT_CLINIC_OR_DEPARTMENT_OTHER)

## 2014-04-10 ENCOUNTER — Inpatient Hospital Stay (HOSPITAL_BASED_OUTPATIENT_CLINIC_OR_DEPARTMENT_OTHER)
Admission: EM | Admit: 2014-04-10 | Discharge: 2014-04-13 | DRG: 103 | Disposition: A | Discharge status: Home / Self Care | Attending: Internal Medicine | Admitting: Internal Medicine

## 2014-04-10 ENCOUNTER — Encounter (HOSPITAL_BASED_OUTPATIENT_CLINIC_OR_DEPARTMENT_OTHER): Payer: Self-pay | Admitting: *Deleted

## 2014-04-10 DIAGNOSIS — F329 Major depressive disorder, single episode, unspecified: Secondary | ICD-10-CM | POA: Diagnosis present

## 2014-04-10 DIAGNOSIS — F419 Anxiety disorder, unspecified: Secondary | ICD-10-CM | POA: Diagnosis present

## 2014-04-10 DIAGNOSIS — F4310 Post-traumatic stress disorder, unspecified: Secondary | ICD-10-CM | POA: Diagnosis present

## 2014-04-10 DIAGNOSIS — R000 Tachycardia, unspecified: Secondary | ICD-10-CM | POA: Diagnosis present

## 2014-04-10 DIAGNOSIS — I119 Hypertensive heart disease without heart failure: Secondary | ICD-10-CM | POA: Diagnosis present

## 2014-04-10 DIAGNOSIS — K219 Gastro-esophageal reflux disease without esophagitis: Secondary | ICD-10-CM | POA: Diagnosis not present

## 2014-04-10 DIAGNOSIS — R9431 Abnormal electrocardiogram [ECG] [EKG]: Secondary | ICD-10-CM | POA: Diagnosis not present

## 2014-04-10 DIAGNOSIS — Z86711 Personal history of pulmonary embolism: Secondary | ICD-10-CM | POA: Diagnosis not present

## 2014-04-10 DIAGNOSIS — Z79899 Other long term (current) drug therapy: Secondary | ICD-10-CM

## 2014-04-10 DIAGNOSIS — R1010 Upper abdominal pain, unspecified: Secondary | ICD-10-CM

## 2014-04-10 DIAGNOSIS — R079 Chest pain, unspecified: Secondary | ICD-10-CM | POA: Diagnosis present

## 2014-04-10 DIAGNOSIS — R51 Headache: Secondary | ICD-10-CM

## 2014-04-10 DIAGNOSIS — Z87820 Personal history of traumatic brain injury: Secondary | ICD-10-CM

## 2014-04-10 DIAGNOSIS — R519 Headache, unspecified: Secondary | ICD-10-CM

## 2014-04-10 DIAGNOSIS — E785 Hyperlipidemia, unspecified: Secondary | ICD-10-CM | POA: Diagnosis present

## 2014-04-10 DIAGNOSIS — F32A Depression, unspecified: Secondary | ICD-10-CM | POA: Diagnosis present

## 2014-04-10 DIAGNOSIS — G43109 Migraine with aura, not intractable, without status migrainosus: Secondary | ICD-10-CM | POA: Diagnosis present

## 2014-04-10 DIAGNOSIS — R072 Precordial pain: Secondary | ICD-10-CM | POA: Diagnosis not present

## 2014-04-10 HISTORY — DX: Activated protein C resistance: D68.51

## 2014-04-10 HISTORY — DX: Unspecified intracranial injury with loss of consciousness of unspecified duration, initial encounter: S06.9X9A

## 2014-04-10 LAB — COMPREHENSIVE METABOLIC PANEL
ALT: 22 U/L (ref 0–53)
AST: 23 U/L (ref 0–37)
Albumin: 4.1 g/dL (ref 3.5–5.2)
Alkaline Phosphatase: 62 U/L (ref 39–117)
Anion gap: 15 (ref 5–15)
BUN: 10 mg/dL (ref 6–23)
CALCIUM: 9.8 mg/dL (ref 8.4–10.5)
CHLORIDE: 101 meq/L (ref 96–112)
CO2: 25 meq/L (ref 19–32)
CREATININE: 1 mg/dL (ref 0.50–1.35)
GFR calc non Af Amer: >90 mL/min (ref >90)
GLUCOSE: 127 mg/dL — AB (ref 70–99)
Potassium: 4 mEq/L (ref 3.7–5.3)
Sodium: 141 mEq/L (ref 137–147)
Total Protein: 8 g/dL (ref 6.0–8.3)

## 2014-04-10 LAB — URINE MICROSCOPIC-ADD ON

## 2014-04-10 LAB — D-DIMER, QUANTITATIVE (NOT AT ARMC)

## 2014-04-10 LAB — URINALYSIS, ROUTINE W REFLEX MICROSCOPIC
BILIRUBIN URINE: NEGATIVE
Glucose, UA: NEGATIVE mg/dL
Ketones, ur: NEGATIVE mg/dL
Leukocytes, UA: NEGATIVE
NITRITE: NEGATIVE
Protein, ur: 30 mg/dL — AB
Specific Gravity, Urine: 1.026 (ref 1.005–1.030)
Urobilinogen, UA: 1 mg/dL (ref 0.0–1.0)
pH: 5.5 (ref 5.0–8.0)

## 2014-04-10 LAB — CBC
HCT: 45.7 % (ref 39.0–52.0)
Hemoglobin: 15.5 g/dL (ref 13.0–17.0)
MCH: 28.9 pg (ref 26.0–34.0)
MCHC: 33.9 g/dL (ref 30.0–36.0)
MCV: 85.3 fL (ref 78.0–100.0)
Platelets: 309 10*3/uL (ref 150–400)
RBC: 5.36 MIL/uL (ref 4.22–5.81)
RDW: 13 % (ref 11.5–15.5)
WBC: 8.5 10*3/uL (ref 4.0–10.5)

## 2014-04-10 LAB — RAPID URINE DRUG SCREEN, HOSP PERFORMED
AMPHETAMINES: NOT DETECTED
BENZODIAZEPINES: NOT DETECTED
Barbiturates: NOT DETECTED
COCAINE: NOT DETECTED
Opiates: NOT DETECTED
TETRAHYDROCANNABINOL: NOT DETECTED

## 2014-04-10 LAB — TROPONIN I: Troponin I: <0.3 ng/mL (ref <0.30)

## 2014-04-10 LAB — T4, FREE: FREE T4: 1.15 ng/dL (ref 0.80–1.80)

## 2014-04-10 MED ORDER — ONDANSETRON HCL 4 MG/2ML IJ SOLN
4.0000 mg | Freq: Once | INTRAMUSCULAR | Status: AC
  Administered 2014-04-10: 4 mg via INTRAVENOUS

## 2014-04-10 MED ORDER — LORAZEPAM 2 MG/ML IJ SOLN
1.0000 mg | Freq: Once | INTRAMUSCULAR | Status: AC
  Administered 2014-04-10: 1 mg via INTRAVENOUS
  Filled 2014-04-10: qty 1

## 2014-04-10 MED ORDER — HYDROMORPHONE HCL 1 MG/ML IJ SOLN
1.0000 mg | Freq: Once | INTRAMUSCULAR | Status: AC
  Administered 2014-04-10: 1 mg via INTRAVENOUS
  Filled 2014-04-10: qty 1

## 2014-04-10 MED ORDER — ACETAMINOPHEN 325 MG PO TABS
650.0000 mg | ORAL_TABLET | Freq: Once | ORAL | Status: AC
  Administered 2014-04-10: 650 mg via ORAL
  Filled 2014-04-10: qty 2

## 2014-04-10 MED ORDER — IOHEXOL 350 MG/ML SOLN
100.0000 mL | Freq: Once | INTRAVENOUS | Status: AC | PRN
  Administered 2014-04-10: 100 mL via INTRAVENOUS

## 2014-04-10 MED ORDER — SODIUM CHLORIDE 0.9 % IV BOLUS (SEPSIS)
1000.0000 mL | Freq: Once | INTRAVENOUS | Status: AC
  Administered 2014-04-10: 1000 mL via INTRAVENOUS

## 2014-04-10 MED ORDER — METOPROLOL TARTRATE 50 MG PO TABS
100.0000 mg | ORAL_TABLET | Freq: Once | ORAL | Status: AC
Start: 2014-04-10 — End: 2014-04-10
  Administered 2014-04-10: 100 mg via ORAL
  Filled 2014-04-10: qty 2

## 2014-04-10 MED ORDER — ONDANSETRON HCL 4 MG/2ML IJ SOLN
INTRAMUSCULAR | Status: AC
  Filled 2014-04-10: qty 2

## 2014-04-10 NOTE — ED Notes (Signed)
Patient transported to CT 

## 2014-04-10 NOTE — ED Notes (Signed)
MD at bedside discussing test results and dispo plan of care. 

## 2014-04-10 NOTE — ED Notes (Signed)
Pt c/o left side chest pain with SOB  x 2 days. Left lower leg pan x 2 weeks HX PE

## 2014-04-10 NOTE — ED Provider Notes (Signed)
CSN: [id number]     Arrival date & time 04/10/14  1534 History   First MD Initiated Contact with Patient 04/10/14 1553     Chief Complaint  Patient presents with  . Chest Pain     (Consider location/radiation/quality/duration/timing/severity/associated sxs/prior Treatment) Patient is a 33 y.o. male presenting with chest pain.  Chest Pain Pain location:  L chest Pain quality: pressure   Pain radiates to:  Does not radiate Pain radiates to the back: no   Pain severity:  Moderate Onset quality:  Gradual Duration:  1 day Timing:  Constant Progression:  Worsening Chronicity:  Recurrent Context comment:  Prior PE in 2009, recurrent chest pain with numerous negative PE and negative stress test Relieved by:  Nothing Worsened by:  Coughing and deep breathing Ineffective treatments:  None tried Associated symptoms: cough and shortness of breath   Associated symptoms: no abdominal pain, no nausea and not vomiting   Associated symptoms comment:  L leg pain x 1 week   Past Medical History  Diagnosis Date  . Pulmonary embolism   . Pleural effusion   . PTSD (post-traumatic stress disorder)   . Migraines   . Depression   . Anxiety   . Insomnia   . Clotting disorder   . Heart murmur   . Hyperlipidemia   . Hypertension   . Shortness of breath   . GERD (gastroesophageal reflux disease)    History reviewed. No pertinent past surgical history. Family History  Problem Relation Age of Onset  . Coronary artery disease     History  Substance Use Topics  . Smoking status: Never Smoker   . Smokeless tobacco: Never Used  . Alcohol Use: 1.2 oz/week    2 Glasses of wine per week     Comment: DAILY    Review of Systems  Respiratory: Positive for cough and shortness of breath.   Cardiovascular: Positive for chest pain.  Gastrointestinal: Negative for nausea, vomiting and abdominal pain.  All other systems reviewed and are negative.     Allergies  Review of patient's allergies  indicates no known allergies.  Home Medications   Prior to Admission medications   Medication Sig Start Date End Date Taking? Authorizing Provider  atorvastatin (LIPITOR) 20 MG tablet Take 20 mg by mouth every evening.     Historical Provider, MD  ergocalciferol (VITAMIN D2) 50000 UNITS capsule Take 50,000 Units by mouth 2 (two) times a week. Taken on Tuesdays and Thursdays.    Historical Provider, MD  hydroxypropyl methylcellulose (ISOPTO TEARS) 2.5 % ophthalmic solution Place 2 drops into both eyes as needed for dry eyes.    Historical Provider, MD  HYDROXYZINE PAMOATE PO Take 50 mg by mouth daily.    Historical Provider, MD  ibuprofen (ADVIL,MOTRIN) 600 MG tablet Take 1 tablet (600 mg total) by mouth every 8 (eight) hours as needed. Take with food. 05/17/13   Suzi RootsKevin E Steinl, MD  metoprolol (LOPRESSOR) 100 MG tablet Take 50 mg by mouth 2 (two) times daily.     Historical Provider, MD  Multiple Vitamin (MULTIVITAMIN) tablet Take 2 tablets by mouth daily.    Historical Provider, MD  prazosin (MINIPRESS) 2 MG capsule Take 8 mg by mouth at bedtime.    Historical Provider, MD  QUEtiapine (SEROQUEL) 25 MG tablet Take 2 tablets (50 mg total) by mouth at bedtime. 09/30/11   Simbiso Ranga, MD  traZODone (DESYREL) 100 MG tablet Take 100 mg by mouth at bedtime.    Historical Provider, MD  BP 144/107 mmHg  Pulse 108  Temp(Src) 98.8 F (37.1 C) (Oral)  Resp 17  Ht 6' (1.829 m)  Wt 198 lb (89.812 kg)  BMI 26.85 kg/m2  SpO2 100% Physical Exam  Constitutional: He is oriented to person, place, and time. He appears well-developed and well-nourished.  HENT:  Head: Normocephalic and atraumatic.  Eyes: Conjunctivae and EOM are normal.  Neck: Normal range of motion. Neck supple.  Cardiovascular: Regular rhythm and normal heart sounds.  Tachycardia present.   Pulmonary/Chest: Effort normal and breath sounds normal. No respiratory distress. He exhibits tenderness.  Abdominal: He exhibits no distension.  There is no tenderness. There is no rebound and no guarding.  Musculoskeletal: Normal range of motion.       Right lower leg: Normal.       Left lower leg: He exhibits tenderness. He exhibits no swelling and no edema.  Neurological: He is alert and oriented to person, place, and time.  Skin: Skin is warm and dry.  Vitals reviewed.   ED Course  Procedures (including critical care time) Labs Review Labs Reviewed  COMPREHENSIVE METABOLIC PANEL - Abnormal; Notable for the following:    Glucose, Bld 127 (*)    Total Bilirubin <0.2 (*)    All other components within normal limits  URINALYSIS, ROUTINE W REFLEX MICROSCOPIC - Abnormal; Notable for the following:    Hgb urine dipstick TRACE (*)    Protein, ur 30 (*)    All other components within normal limits  URINE MICROSCOPIC-ADD ON - Abnormal; Notable for the following:    Bacteria, UA FEW (*)    Casts HYALINE CASTS (*)    Crystals CA OXALATE CRYSTALS (*)    All other components within normal limits  CBC  TROPONIN I  D-DIMER, QUANTITATIVE  URINE RAPID DRUG SCREEN (HOSP PERFORMED)  T4, FREE  TROPONIN I  TSH    Imaging Review Dg Chest 2 View  04/10/2014   CLINICAL DATA:  Left-sided chest pain with shortness of breath for the past 2 days; left lower leg pain for 2 weeks; history of DVT and pulmonary embolism  EXAM: CHEST  2 VIEW  COMPARISON:  PA and lateral chest of May 17, 2013  FINDINGS: The lungs are adequately inflated. There is no focal infiltrate. The heart and pulmonary vascularity are normal. The mediastinum is normal in width. There is no pleural effusion. The bony thorax is unremarkable.  IMPRESSION: There is no active cardiopulmonary disease.   Electronically Signed   By: David  [place]   On: 04/10/2014 16:47   Ct Angio Chest Pe W/cm &/or Wo Cm  04/10/2014   CLINICAL DATA:  32 year old male with left chest pain and shortness of breath. Initial encounter.  EXAM: CT ANGIOGRAPHY CHEST WITH CONTRAST  TECHNIQUE: Multidetector  CT imaging of the chest was performed using the standard protocol during bolus administration of intravenous contrast. Multiplanar CT image reconstructions and MIPs were obtained to evaluate the vascular anatomy.  CONTRAST:  OMNIPAQUE IOHEXOL 350 MG/ML SOLN  COMPARISON:  04/10/2014 and prior chest radiographs.  FINDINGS: This is a technically satisfactory study.  No pulmonary emboli identified.  There is no evidence of thoracic aortic aneurysm is or definite dissection.  Upper limits normal heart size noted.  There is no evidence of pleural/ pericardial effusion or enlarged lymph nodes.  Minimal bibasilar scarring again noted.  There is no evidence of airspace disease, consolidation, nodule, mass or endobronchial/endotracheal lesion.  There is no evidence of pneumothorax.  No acute  or suspicious bony abnormalities are identified.  Fatty infiltration of the visualized liver is noted.  Review of the MIP images confirms the above findings.  IMPRESSION: No evidence of acute abnormality. No evidence of pulmonary emboli or thoracic aortic aneurysm/ definite dissection.  Fatty infiltration of the liver.   Electronically Signed   By: Laveda AbbeJeff  Hu M.D.   On: 04/10/2014 19:35   Ct Renal Stone Study  04/10/2014   CLINICAL DATA:  LEFT lower quadrant pain for 2 days.  Kidney stone.  EXAM: CT ABDOMEN AND PELVIS WITHOUT CONTRAST  TECHNIQUE: Multidetector CT imaging of the abdomen and pelvis was performed following the standard protocol without IV contrast.  COMPARISON:  09/30/2011.  FINDINGS: Musculoskeletal:  Normal.  Lung Bases: Atelectasis.  Liver: Hepatosteatosis with borderline hepatomegaly. Liver span is 18.8 cm.  Spleen:  Normal.  Gallbladder:  Contracted.  Common bile duct:  Normal.  Pancreas:  Normal.  Adrenal glands:  Normal bilaterally.  Kidneys: Normal. No stones or hydronephrosis. Both ureters appear within normal limits.  Stomach:  Normal.  Small bowel:  Normal.  Colon: Normal appendix. Normal rectosigmoid. No  inflammatory changes of colon.  Pelvic Genitourinary:  Collapsed.  Peritoneum: No free air or free fluid.  No mesenteric adenopathy.  Vasculature: Normal.  Body Wall: Fat containing periumbilical hernia.  IMPRESSION: 1. No acute abnormality. 2. Hepatosteatosis.   Electronically Signed   By: Andreas NewportGeoffrey  Lamke M.D.   On: 04/10/2014 17:38     EKG Interpretation   Date/Time:  Tuesday April 10 2014 15:45:14 EST Ventricular Rate:  122 PR Interval:  140 QRS Duration: 84 QT Interval:  324 QTC Calculation: 461 R Axis:   56 Text Interpretation:  Sinus tachycardia Possible Anterior infarct , age  undetermined Abnormal ECG No significant change since last tracing  Confirmed by Mirian MoGentry, Jaedin Trumbo [phone]([id number]) on 04/10/2014 5:48:58 PM      MDM   Final diagnoses:  Chest pain  Upper abdominal pain    33 y.o. male with pertinent PMH of PE (2009 with numerous subsequent negative PE scans), chronic chest pain, anxiety presents with recurrent chest pain as described above.  No fevers, however pt has had increased cough.  He is not on anticoagulation.  Patient has been seen numerous times with negative PE studies in the past. In general he comes in tachycardic, has a negative workup, receives Ativan and has improvement in all limbs. On arrival today vitals signs and physical exam as above. Chest pain atypical for ACS. Obtain lab work, EKG as above were unremarkable. UA demonstrated calcium oxalate crystals in blood. CT stone study obtained for possibility of atypical renal colic. This was unremarkable. The patient maintained persistent tachycardia despite fluids, pain medicine, benzodiazepines. Subsequently CT angiogram of the chest was obtained which was unremarkable. Despite numerous rounds of fluid, pain control, benzos, patient maintained tachycardia. The troponin unremarkable. Discussed with the patient that his tachycardia was likely chronic given that he had seen a cardiologist for the same, however, the patient  became diffusely diaphoretic. Given that he has had persistent chest pain and diaphoresis will admit for further workup.  1. Chest pain   2. Upper abdominal pain         Mirian MoMatthew Maysoon Lozada, MD 04/11/14 [phone]

## 2014-04-10 NOTE — ED Notes (Signed)
Pt requested pain med-EDP notified

## 2014-04-10 NOTE — ED Notes (Signed)
Patient transported to X-ray 

## 2014-04-11 ENCOUNTER — Encounter (HOSPITAL_COMMUNITY): Payer: Self-pay | Admitting: General Practice

## 2014-04-11 DIAGNOSIS — F4310 Post-traumatic stress disorder, unspecified: Secondary | ICD-10-CM

## 2014-04-11 DIAGNOSIS — R079 Chest pain, unspecified: Secondary | ICD-10-CM

## 2014-04-11 DIAGNOSIS — Z86711 Personal history of pulmonary embolism: Secondary | ICD-10-CM

## 2014-04-11 DIAGNOSIS — G43909 Migraine, unspecified, not intractable, without status migrainosus: Secondary | ICD-10-CM

## 2014-04-11 DIAGNOSIS — E785 Hyperlipidemia, unspecified: Secondary | ICD-10-CM

## 2014-04-11 DIAGNOSIS — F32A Depression, unspecified: Secondary | ICD-10-CM | POA: Diagnosis present

## 2014-04-11 DIAGNOSIS — F329 Major depressive disorder, single episode, unspecified: Secondary | ICD-10-CM | POA: Diagnosis present

## 2014-04-11 DIAGNOSIS — I10 Essential (primary) hypertension: Secondary | ICD-10-CM

## 2014-04-11 LAB — TROPONIN I
Troponin I: <0.3 ng/mL (ref <0.30)
Troponin I: <0.3 ng/mL (ref <0.30)
Troponin I: <0.3 ng/mL (ref <0.30)

## 2014-04-11 LAB — GLUCOSE, CAPILLARY: Glucose-Capillary: 101 mg/dL — ABNORMAL HIGH (ref 70–99)

## 2014-04-11 LAB — LIPASE, BLOOD: Lipase: 24 U/L (ref 11–59)

## 2014-04-11 MED ORDER — HYPROMELLOSE (GONIOSCOPIC) 2.5 % OP SOLN: 2.0000 [drp] | OPHTHALMIC | Status: DC | PRN

## 2014-04-11 MED ORDER — DIPHENHYDRAMINE HCL 50 MG/ML IJ SOLN
12.5000 mg | Freq: Once | INTRAMUSCULAR | Status: AC
  Administered 2014-04-11: 12.5 mg via INTRAVENOUS
  Filled 2014-04-11: qty 1

## 2014-04-11 MED ORDER — ONDANSETRON HCL 4 MG PO TABS: 4.0000 mg | ORAL_TABLET | Freq: Four times a day (QID) | ORAL | Status: DC | PRN

## 2014-04-11 MED ORDER — KETOROLAC TROMETHAMINE 30 MG/ML IJ SOLN
30.0000 mg | Freq: Four times a day (QID) | INTRAMUSCULAR | Status: DC | PRN
  Administered 2014-04-11: 30 mg via INTRAVENOUS
  Filled 2014-04-11: qty 1

## 2014-04-11 MED ORDER — DIPHENHYDRAMINE HCL 50 MG/ML IJ SOLN
25.0000 mg | Freq: Four times a day (QID) | INTRAMUSCULAR | Status: DC | PRN
  Administered 2014-04-11: 25 mg via INTRAVENOUS
  Filled 2014-04-11: qty 1

## 2014-04-11 MED ORDER — PRAZOSIN HCL 5 MG PO CAPS
8.0000 mg | ORAL_CAPSULE | Freq: Every day | ORAL | Status: DC
  Administered 2014-04-11 – 2014-04-12 (×2): 8 mg via ORAL
  Filled 2014-04-11 (×3): qty 1

## 2014-04-11 MED ORDER — TRAZODONE HCL 100 MG PO TABS
100.0000 mg | ORAL_TABLET | Freq: Every day | ORAL | Status: DC
  Administered 2014-04-11 – 2014-04-12 (×2): 100 mg via ORAL
  Filled 2014-04-11 (×2): qty 1

## 2014-04-11 MED ORDER — METOCLOPRAMIDE HCL 5 MG/ML IJ SOLN
10.0000 mg | Freq: Four times a day (QID) | INTRAMUSCULAR | Status: DC | PRN
  Administered 2014-04-11: 10 mg via INTRAVENOUS
  Filled 2014-04-11: qty 2

## 2014-04-11 MED ORDER — KETOROLAC TROMETHAMINE 30 MG/ML IJ SOLN
30.0000 mg | Freq: Once | INTRAMUSCULAR | Status: AC
  Administered 2014-04-11: 30 mg via INTRAVENOUS
  Filled 2014-04-11: qty 1

## 2014-04-11 MED ORDER — METOPROLOL TARTRATE 50 MG PO TABS
50.0000 mg | ORAL_TABLET | Freq: Two times a day (BID) | ORAL | Status: DC
  Administered 2014-04-11 – 2014-04-13 (×5): 50 mg via ORAL
  Filled 2014-04-11 (×2): qty 1
  Filled 2014-04-11: qty 2
  Filled 2014-04-11 (×2): qty 1

## 2014-04-11 MED ORDER — ATORVASTATIN CALCIUM 20 MG PO TABS
20.0000 mg | ORAL_TABLET | Freq: Every evening | ORAL | Status: DC
  Administered 2014-04-11 – 2014-04-12 (×2): 20 mg via ORAL
  Filled 2014-04-11 (×2): qty 1

## 2014-04-11 MED ORDER — PANTOPRAZOLE SODIUM 40 MG PO TBEC
40.0000 mg | DELAYED_RELEASE_TABLET | Freq: Every day | ORAL | Status: DC
  Administered 2014-04-11 – 2014-04-12 (×2): 40 mg via ORAL
  Filled 2014-04-11 (×2): qty 1

## 2014-04-11 MED ORDER — HYDROMORPHONE HCL 1 MG/ML IJ SOLN
1.0000 mg | Freq: Once | INTRAMUSCULAR | Status: AC
  Administered 2014-04-11: 1 mg via INTRAVENOUS
  Filled 2014-04-11: qty 1

## 2014-04-11 MED ORDER — QUETIAPINE FUMARATE 50 MG PO TABS
50.0000 mg | ORAL_TABLET | Freq: Every day | ORAL | Status: DC
  Administered 2014-04-11 – 2014-04-12 (×2): 50 mg via ORAL
  Filled 2014-04-11 (×2): qty 1

## 2014-04-11 MED ORDER — LIDOCAINE 4 % EX CREA
1.0000 "application " | TOPICAL_CREAM | Freq: Three times a day (TID) | CUTANEOUS | Status: DC | PRN
  Filled 2014-04-11: qty 5

## 2014-04-11 MED ORDER — CETYLPYRIDINIUM CHLORIDE 0.05 % MT LIQD
7.0000 mL | Freq: Two times a day (BID) | OROMUCOSAL | Status: DC
  Administered 2014-04-11 – 2014-04-13 (×3): 7 mL via OROMUCOSAL

## 2014-04-11 MED ORDER — HYDROXYZINE PAMOATE 50 MG PO CAPS
50.0000 mg | ORAL_CAPSULE | Freq: Every day | ORAL | Status: DC
Start: 2014-04-11 — End: 2014-04-13
  Administered 2014-04-11 – 2014-04-13 (×3): 50 mg via ORAL
  Filled 2014-04-11 (×3): qty 1

## 2014-04-11 MED ORDER — ADULT MULTIVITAMIN W/MINERALS CH
2.0000 | ORAL_TABLET | Freq: Every day | ORAL | Status: DC
  Administered 2014-04-11 – 2014-04-13 (×3): 2 via ORAL
  Filled 2014-04-11 (×5): qty 2

## 2014-04-11 MED ORDER — ONDANSETRON HCL 4 MG/2ML IJ SOLN: 4.0000 mg | Freq: Four times a day (QID) | INTRAMUSCULAR | Status: DC | PRN

## 2014-04-11 MED ORDER — SODIUM CHLORIDE 0.9 % IJ SOLN
3.0000 mL | Freq: Two times a day (BID) | INTRAMUSCULAR | Status: DC
  Administered 2014-04-11 – 2014-04-13 (×5): 3 mL via INTRAVENOUS

## 2014-04-11 MED ORDER — INFLUENZA VAC SPLIT QUAD 0.5 ML IM SUSY
0.5000 mL | PREFILLED_SYRINGE | INTRAMUSCULAR | Status: AC
  Administered 2014-04-12: 0.5 mL via INTRAMUSCULAR
  Filled 2014-04-11: qty 0.5

## 2014-04-11 MED ORDER — POLYVINYL ALCOHOL 1.4 % OP SOLN
2.0000 [drp] | OPHTHALMIC | Status: DC | PRN
  Filled 2014-04-11: qty 15

## 2014-04-11 MED ORDER — METOCLOPRAMIDE HCL 5 MG/ML IJ SOLN
10.0000 mg | Freq: Once | INTRAMUSCULAR | Status: AC
  Administered 2014-04-11: 10 mg via INTRAVENOUS
  Filled 2014-04-11: qty 2

## 2014-04-11 MED ORDER — VITAMIN D (ERGOCALCIFEROL) 1.25 MG (50000 UNIT) PO CAPS
50000.0000 [IU] | ORAL_CAPSULE | ORAL | Status: DC
  Administered 2014-04-12: 50000 [IU] via ORAL
  Filled 2014-04-11: qty 1

## 2014-04-11 MED ORDER — HEPARIN SODIUM (PORCINE) 5000 UNIT/ML IJ SOLN
5000.0000 [IU] | Freq: Three times a day (TID) | INTRAMUSCULAR | Status: DC
  Administered 2014-04-11 – 2014-04-13 (×7): 5000 [IU] via SUBCUTANEOUS
  Filled 2014-04-11 (×7): qty 1

## 2014-04-11 MED ORDER — TOPIRAMATE 25 MG PO TABS
50.0000 mg | ORAL_TABLET | Freq: Two times a day (BID) | ORAL | Status: DC
  Administered 2014-04-11 – 2014-04-12 (×2): 50 mg via ORAL
  Filled 2014-04-11 (×3): qty 2

## 2014-04-11 NOTE — Progress Notes (Signed)
PATIENT DETAILS Name: Harold Cohen Age: 33 y.o. Sex: male Date of Birth: 01-28-1981 Admit Date: 04/10/2014 Admitting Physician Lorretta Harp, MD ZOX:[id number],UJWJX N, MD  Subjective: Admitted with multiple complaints-Headache/Chest pain/Left leg pain.No   Assessment/Plan: Principal Problem:   Complicated migraine:known hx of migraine-worsening headache for the past few days-with photophobia/phonophobia. Will try cocktail of IV Toradol, Reglan and Benadryl. May need to start on Topamax.  Active Problems:   Chest Pain:clearly atypical-musculoskeletal-easily reproducible on palpations.Troponin so far negative. Per cards outpatient note- Stress Echo in 2013 negative. Supportive care and monitor.   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 CTA chest neg for PE,will check TSH. Defer further work up to the outpatient setting.Claims he was referred to [facility] by Dr Sharion Dove saw a cardiologist there that did not think patient had POTS (although I dont see documentation. Monitor in Telemetry   BJY:NWGNFAOZHY, continue Metoprolol and Prazosin.   Psych issues: Including depression and PTSD. No suicidal or homicidal ideations on admission. -continue home meds: Seroquel and trazodone  ?Black tarry stools:none since admission, monitor CBC-if stable-no need for any inpatient work up    Dyslipidemia:c/w Statins  Disposition: Remain inpatient  Antibiotics:  None   Anti-infectives    None      DVT Prophylaxis: Prophylactic Heparin   Code Status: Full code   Family Communication Spouse at bedside  Procedures:  None  CONSULTS:  None  Time spent 40 minutes-which includes 50% of the time with face-to-face with patient/ family and coordinating care related to the above assessment and plan.  MEDICATIONS: Scheduled Meds: . atorvastatin  20 mg Oral QPM  . heparin  5,000 Units Subcutaneous 3 times per day  . hydrOXYzine   50 mg Oral Daily  . [START ON 04/12/2014] Influenza vac split quadrivalent PF  0.5 mL Intramuscular Tomorrow-1000  . metoprolol  50 mg Oral BID  . multivitamin with minerals  2 tablet Oral Daily  . pantoprazole  40 mg Oral Q1200  . prazosin  8 mg Oral QHS  . QUEtiapine  50 mg Oral QHS  . sodium chloride  3 mL Intravenous Q12H  . traZODone  100 mg Oral QHS  . [START ON 04/12/2014] Vitamin D (Ergocalciferol)  50,000 Units Oral Once per day on Mon Thu   Continuous Infusions:  PRN Meds:.lidocaine, ondansetron **OR** ondansetron (ZOFRAN) IV, polyvinyl alcohol    PHYSICAL EXAM: Vital signs in last 24 hours: Filed Vitals:   04/11/14 0054 04/11/14 0247 04/11/14 0342 04/11/14 0934  BP: 149/89 150/101  137/72  Pulse: 108 101    Temp: 98.2 F (36.8 C)  97.8 F (36.6 C)   TempSrc: Oral  Oral   Resp: 18 14  14   Height:   6' (1.829 m)   Weight:   95.89 kg (211 lb 6.4 oz)   SpO2: 100% 100%  98%    Weight change:  Filed Weights   04/10/14 1543 04/11/14 0342  Weight: 89.812 kg (198 lb) 95.89 kg (211 lb 6.4 oz)   Body mass index is 28.66 kg/(m^2).   Gen Exam: Awake and alert with clear speech.   Neck: Supple, No JVD.   Chest: B/L Clear.   CVS: S1 S2 Regular, no murmurs.  Abdomen: soft, BS +, non tender, non distended.  Extremities: no edema, lower extremities warm to touch. Neurologic: Non Focal.   Skin: No Rash.   Wounds: N/A.   Intake/Output from previous day:  Intake/Output Summary (Last 24 hours) at 04/11/14 1131 Last data filed at 04/11/14 1007  Gross per 24 hour  Intake      3 ml  Output      0 ml  Net      3 ml     LAB RESULTS: CBC  Recent Labs Lab 04/10/14 1550  WBC 8.5  HGB 15.5  HCT 45.7  PLT 309  MCV 85.3  MCH 28.9  MCHC 33.9  RDW 13.0    Chemistries   Recent Labs Lab 04/10/14 1550  NA 141  K 4.0  CL 101  CO2 25  GLUCOSE 127*  BUN 10  CREATININE 1.00  CALCIUM 9.8    CBG:  Recent Labs Lab 04/11/14 0746  GLUCAP 101*     GFR Estimated Creatinine Clearance: 125 mL/min (by C-G formula based on Cr of 1).  Coagulation profile No results for input(s): INR, PROTIME in the last 168 hours.  Cardiac Enzymes  Recent Labs Lab 04/10/14 1550 04/10/14 2143 04/11/14 0543  TROPONINI <0.30 <0.30 <0.30    Invalid input(s): POCBNP  Recent Labs  04/10/14 1550  DDIMER <0.27   No results for input(s): HGBA1C in the last 72 hours. No results for input(s): CHOL, HDL, LDLCALC, TRIG, CHOLHDL, LDLDIRECT in the last 72 hours. No results for input(s): TSH, T4TOTAL, T3FREE, THYROIDAB in the last 72 hours.  Invalid input(s): FREET3 No results for input(s): VITAMINB12, FOLATE, FERRITIN, TIBC, IRON, RETICCTPCT in the last 72 hours.  Recent Labs  04/11/14 0543  LIPASE 24    Urine Studies No results for input(s): UHGB, CRYS in the last 72 hours.  Invalid input(s): UACOL, UAPR, USPG, UPH, UTP, UGL, UKET, UBIL, UNIT, UROB, ULEU, UEPI, UWBC, URBC, UBAC, CAST, UCOM, BILUA  MICROBIOLOGY: No results found for this or any previous visit (from the past 240 hour(s)).  RADIOLOGY STUDIES/RESULTS: Dg Chest 2 View  04/10/2014   CLINICAL DATA:  Left-sided chest pain with shortness of breath for the past 2 days; left lower leg pain for 2 weeks; history of DVT and pulmonary embolism  EXAM: CHEST  2 VIEW  COMPARISON:  PA and lateral chest of May 17, 2013  FINDINGS: The lungs are adequately inflated. There is no focal infiltrate. The heart and pulmonary vascularity are normal. The mediastinum is normal in width. There is no pleural effusion. The bony thorax is unremarkable.  IMPRESSION: There is no active cardiopulmonary disease.   Electronically Signed   By: David  [place]   On: 04/10/2014 16:47   Ct Angio Chest Pe W/cm &/or Wo Cm  04/10/2014   CLINICAL DATA:  33 year old male with left chest pain and shortness of breath. Initial encounter.  EXAM: CT ANGIOGRAPHY CHEST WITH CONTRAST  TECHNIQUE: Multidetector CT imaging of the  chest was performed using the standard protocol during bolus administration of intravenous contrast. Multiplanar CT image reconstructions and MIPs were obtained to evaluate the vascular anatomy.  CONTRAST:  100mL OMNIPAQUE IOHEXOL 350 MG/ML SOLN  COMPARISON:  04/10/2014 and prior chest radiographs.  FINDINGS: This is a technically satisfactory study.  No pulmonary emboli identified.  There is no evidence of thoracic aortic aneurysm is or definite dissection.  Upper limits normal heart size noted.  There is no evidence of pleural/ pericardial effusion or enlarged lymph nodes.  Minimal bibasilar scarring again noted.  There is no evidence of airspace disease, consolidation, nodule, mass or endobronchial/endotracheal lesion.  There is no evidence of pneumothorax.  No acute or suspicious bony abnormalities are identified.  Fatty infiltration of the visualized liver is noted.  Review of the MIP images confirms the above findings.  IMPRESSION: No evidence of acute abnormality. No evidence of pulmonary emboli or thoracic aortic aneurysm/ definite dissection.  Fatty infiltration of the liver.   Electronically Signed   By: Laveda AbbeJeff  Hu M.D.   On: 04/10/2014 19:35   Ct Renal Stone Study  04/10/2014   CLINICAL DATA:  LEFT lower quadrant pain for 2 days.  Kidney stone.  EXAM: CT ABDOMEN AND PELVIS WITHOUT CONTRAST  TECHNIQUE: Multidetector CT imaging of the abdomen and pelvis was performed following the standard protocol without IV contrast.  COMPARISON:  09/30/2011.  FINDINGS: Musculoskeletal:  Normal.  Lung Bases: Atelectasis.  Liver: Hepatosteatosis with borderline hepatomegaly. Liver span is 18.8 cm.  Spleen:  Normal.  Gallbladder:  Contracted.  Common bile duct:  Normal.  Pancreas:  Normal.  Adrenal glands:  Normal bilaterally.  Kidneys: Normal. No stones or hydronephrosis. Both ureters appear within normal limits.  Stomach:  Normal.  Small bowel:  Normal.  Colon: Normal appendix. Normal rectosigmoid. No inflammatory  changes of colon.  Pelvic Genitourinary:  Collapsed.  Peritoneum: No free air or free fluid.  No mesenteric adenopathy.  Vasculature: Normal.  Body Wall: Fat containing periumbilical hernia.  IMPRESSION: 1. No acute abnormality. 2. Hepatosteatosis.   Electronically Signed   By: Andreas NewportGeoffrey  Lamke M.D.   On: 04/10/2014 17:38    Jeoffrey MassedGHIMIRE,Allen Egerton, MD  [facility] Pager:[phone]  If 7PM-7AM, please contact night-coverage [email or web address] Password [email or web address] 04/11/2014, 11:31 AM   LOS: 1 day

## 2014-04-11 NOTE — Progress Notes (Signed)
UR completed 

## 2014-04-11 NOTE — H&P (Signed)
[facility] History and Physical  Harold Cohen ZOX:[id number] DOB: 1980/09/03 DOA: 04/10/2014  Referring physician: ED physician PCP: Gwynneth AlimentSANDERS,ROBYN N, MD  Specialists:   Chief Complaint: Chest pain, leg pain, headache  HPI: Harold HongCarlos Cueto is a 33 y.o. male with PMH of complicated migraine, anxiety, PTSD, depression, hx of bilateral PE, Clotting disorder (was told he had LeidenV, then later tested negative), history of non cardiac chest pain, hypertension, hyperlipidemia, GERD, presents with  chest pain, leg pain, headache.  Patient reports that in the past 2 week, he has been having pain over left chest and left flank area. It is constant, 7-8 out of 10 in severity, associated with shortness of breath and palpitation. He also has dry cough. No fever, but has chills. He denies any injury. He also reports having left leg pain over the calf area.  Patient also reports that he has been having severe headache. It is associated with nausea, and vomiting. He vomited 3 times. He does not have hearing loss, vision change, unilateral weakness in his extremities. Patient reports having mild diarrhea with tarry and black stools.   In ED, CTA was negative for pulmonary embolism or other acute abnormalities. CT-renal stone study is negative. Hemoglobin 15.5. No leukocytosis. Initial troponin negative. UDS negative. Urinalysis negative. Patient is admitted to inpatient for further evaluation and treatment.  Review of Systems: As presented in the history of presenting illness, rest negative.  Where does patient live? At home  Can patient participate in ADLs? Yes  Allergy: No Known Allergies  Past Medical History  Diagnosis Date  . Pulmonary embolism   . Pleural effusion   . PTSD (post-traumatic stress disorder)   . Migraines   . Depression   . Anxiety   . Insomnia   . Clotting disorder   . Heart murmur   . Hyperlipidemia   . Hypertension   . Shortness of breath   . GERD  (gastroesophageal reflux disease)   . TBI (traumatic brain injury) 2010    History reviewed. No pertinent past surgical history.  Social History:  reports that he has never smoked. He has never used smokeless tobacco. He reports that he drinks about 1.2 oz of alcohol per week. He reports that he does not use illicit drugs.  Family History:  Family History  Problem Relation Age of Onset  . Coronary artery disease       Prior to Admission medications   Medication Sig Start Date End Date Taking? Authorizing Provider  lidocaine (LMX) 4 % cream Apply 1 application topically 3 (three) times daily as needed (anal fissures).   Yes Historical Provider, MD  atorvastatin (LIPITOR) 20 MG tablet Take 20 mg by mouth every evening.     Historical Provider, MD  ergocalciferol (VITAMIN D2) 50000 UNITS capsule Take 50,000 Units by mouth 2 (two) times a week. Taken on Tuesdays and Thursdays.    Historical Provider, MD  hydroxypropyl methylcellulose (ISOPTO TEARS) 2.5 % ophthalmic solution Place 2 drops into both eyes as needed for dry eyes.    Historical Provider, MD  HYDROXYZINE PAMOATE PO Take 50 mg by mouth daily.    Historical Provider, MD  ibuprofen (ADVIL,MOTRIN) 600 MG tablet Take 1 tablet (600 mg total) by mouth every 8 (eight) hours as needed. Take with food. 05/17/13   Suzi RootsKevin E Steinl, MD  metoprolol (LOPRESSOR) 100 MG tablet Take 50 mg by mouth 2 (two) times daily.     Historical Provider, MD  Multiple Vitamin (MULTIVITAMIN) tablet Take 2 tablets  by mouth daily.    Historical Provider, MD  prazosin (MINIPRESS) 2 MG capsule Take 8 mg by mouth at bedtime.    Historical Provider, MD  QUEtiapine (SEROQUEL) 25 MG tablet Take 2 tablets (50 mg total) by mouth at bedtime. 09/30/11   Simbiso Ranga, MD  traZODone (DESYREL) 100 MG tablet Take 100 mg by mouth at bedtime.    Historical Provider, MD    Physical Exam: Filed Vitals:   04/11/14 0013 04/11/14 0054 04/11/14 0247 04/11/14 0342  BP:  149/89 150/101    Pulse: 108 108 101   Temp:  98.2 F (36.8 C)  97.8 F (36.6 C)  TempSrc:  Oral  Oral  Resp:  18 14   Height:    6' (1.829 m)  Weight:    95.89 kg (211 lb 6.4 oz)  SpO2:  100% 100%    General: Not in acute distress HEENT:       Eyes: PERRL, EOMI, no scleral icterus       ENT: No discharge from the ears and nose, no pharynx injection, no tonsillar enlargement.        Neck: No JVD, no bruit, no mass felt. Cardiac: S1/S2, RRR, tachycardia, No murmurs, No gallops or rubs Pulm: Good air movement bilaterally. Clear to auscultation bilaterally. No rales, wheezing, rhonchi or rubs. Abd: Soft, nondistended, nontender, no rebound pain, no organomegaly, BS present Ext: No edema bilaterally. 2+DP/PT pulse bilaterally Musculoskeletal:  Tenderness over left calf area, left flank area Skin: No rashes.  Neuro: Alert and oriented X3, cranial nerves II-XII grossly intact, muscle strength 5/5 in all extremeties, sensation to light touch intact. Brachial reflex 2+ bilaterally. Knee reflex 1+ bilaterally. Negative Babinski's sign. Normal finger to nose test. Psych: Patient is not psychotic, no suicidal or hemocidal ideation.  Labs on Admission:  Basic Metabolic Panel:  Recent Labs Lab 04/10/14 1550  NA 141  K 4.0  CL 101  CO2 25  GLUCOSE 127*  BUN 10  CREATININE 1.00  CALCIUM 9.8   Liver Function Tests:  Recent Labs Lab 04/10/14 1550  AST 23  ALT 22  ALKPHOS 62  BILITOT <0.2*  PROT 8.0  ALBUMIN 4.1   No results for input(s): LIPASE, AMYLASE in the last 168 hours. No results for input(s): AMMONIA in the last 168 hours. CBC:  Recent Labs Lab 04/10/14 1550  WBC 8.5  HGB 15.5  HCT 45.7  MCV 85.3  PLT 309   Cardiac Enzymes:  Recent Labs Lab 04/10/14 1550 04/10/14 2143  TROPONINI <0.30 <0.30    BNP (last 3 results)  Recent Labs  05/17/13 1220  PROBNP <5.0   CBG: No results for input(s): GLUCAP in the last 168 hours.  Radiological Exams on Admission: Dg Chest  2 View  04/10/2014   CLINICAL DATA:  Left-sided chest pain with shortness of breath for the past 2 days; left lower leg pain for 2 weeks; history of DVT and pulmonary embolism  EXAM: CHEST  2 VIEW  COMPARISON:  PA and lateral chest of May 17, 2013  FINDINGS: The lungs are adequately inflated. There is no focal infiltrate. The heart and pulmonary vascularity are normal. The mediastinum is normal in width. There is no pleural effusion. The bony thorax is unremarkable.  IMPRESSION: There is no active cardiopulmonary disease.   Electronically Signed   By: David  [place]az[place]   On: 04/10/2014 16:47   Ct Angio Chest Pe W/cm &/or Wo Cm  04/10/2014   CLINICAL DATA:  33 year old male  with left chest pain and shortness of breath. Initial encounter.  EXAM: CT ANGIOGRAPHY CHEST WITH CONTRAST  TECHNIQUE: Multidetector CT imaging of the chest was performed using the standard protocol during bolus administration of intravenous contrast. Multiplanar CT image reconstructions and MIPs were obtained to evaluate the vascular anatomy.  CONTRAST:  OMNIPAQUE IOHEXOL 350 MG/ML SOLN  COMPARISON:  04/10/2014 and prior chest radiographs.  FINDINGS: This is a technically satisfactory study.  No pulmonary emboli identified.  There is no evidence of thoracic aortic aneurysm is or definite dissection.  Upper limits normal heart size noted.  There is no evidence of pleural/ pericardial effusion or enlarged lymph nodes.  Minimal bibasilar scarring again noted.  There is no evidence of airspace disease, consolidation, nodule, mass or endobronchial/endotracheal lesion.  There is no evidence of pneumothorax.  No acute or suspicious bony abnormalities are identified.  Fatty infiltration of the visualized liver is noted.  Review of the MIP images confirms the above findings.  IMPRESSION: No evidence of acute abnormality. No evidence of pulmonary emboli or thoracic aortic aneurysm/ definite dissection.  Fatty infiltration of the liver.    Electronically Signed   By: Laveda Abbe M.D.   On: 04/10/2014 19:35   Ct Renal Stone Study  04/10/2014   CLINICAL DATA:  LEFT lower quadrant pain for 2 days.  Kidney stone.  EXAM: CT ABDOMEN AND PELVIS WITHOUT CONTRAST  TECHNIQUE: Multidetector CT imaging of the abdomen and pelvis was performed following the standard protocol without IV contrast.  COMPARISON:  09/30/2011.  FINDINGS: Musculoskeletal:  Normal.  Lung Bases: Atelectasis.  Liver: Hepatosteatosis with borderline hepatomegaly. Liver span is 18.8 cm.  Spleen:  Normal.  Gallbladder:  Contracted.  Common bile duct:  Normal.  Pancreas:  Normal.  Adrenal glands:  Normal bilaterally.  Kidneys: Normal. No stones or hydronephrosis. Both ureters appear within normal limits.  Stomach:  Normal.  Small bowel:  Normal.  Colon: Normal appendix. Normal rectosigmoid. No inflammatory changes of colon.  Pelvic Genitourinary:  Collapsed.  Peritoneum: No free air or free fluid.  No mesenteric adenopathy.  Vasculature: Normal.  Body Wall: Fat containing periumbilical hernia.  IMPRESSION: 1. No acute abnormality. 2. Hepatosteatosis.   Electronically Signed   By: Andreas Newport M.D.   On: 04/10/2014 17:38    EKG: Independently reviewed.   Assessment/Plan Principal Problem:   Complicated migraine Active Problems:   Post traumatic stress disorder (PTSD)   Hx pulmonary embolism   Hypertension   Hyperlipidemia   Tachycardia   Chest pain   Depression  Complicated migraine: Patient's presentation of pain all over is likely due to complicated migraine or psych etiology. His chest pain doesn't sound cardiac. PE was ruled out by negative CTA. Patient has significant psychiatric history as he was traumatized during the war when [place] was in the [employer], with history of migraine. It is likely that he has complicated migraine.  -will admit to tele bed -switch ibuprofen to IV Toradol when necessary -trop x 3 to rule out ACS though less likely -EKG in Am  HLD: LDL was 143  on 06/30/12. On Lipitor at home -Continue Lipitor -Check FLP  HTN: - Continue metoprolol  - also on prazosin  Psych issues: Including depression and PTSD. No suicidal or homicidal ideations on admission. -continue home meds: Seroquel and trazodone  Tarry stool: Unclear etiology. Hemoglobin 15.5, indicating unlikely to have massive bleeding. He has a history of anal fissure -Continue lidocaine cream -Check FOBT - repeat CBC  DVT ppx:  SQ Heparin   Code Status: Full code Family Communication: None at bed side.  Disposition Plan: Admit to inpatient   Date of Service 04/11/2014    Lorretta Harp [facility] Pager [phone]  If 7PM-7AM, please contact night-coverage [email or web address] Password [facility] 04/11/2014, 5:37 AM

## 2014-04-11 NOTE — Progress Notes (Signed)
*  PRELIMINARY RESULTS* Vascular Ultrasound Lower extremity venous duplex has been completed.  Preliminary findings: no evidence of DVT  Farrel DemarkJill Eunice, RDMS, RVT  04/11/2014, 11:13 AM

## 2014-04-12 ENCOUNTER — Observation Stay (HOSPITAL_COMMUNITY)

## 2014-04-12 ENCOUNTER — Encounter (HOSPITAL_COMMUNITY): Payer: Self-pay | Admitting: Neurology

## 2014-04-12 DIAGNOSIS — R9431 Abnormal electrocardiogram [ECG] [EKG]: Secondary | ICD-10-CM | POA: Diagnosis present

## 2014-04-12 DIAGNOSIS — I119 Hypertensive heart disease without heart failure: Secondary | ICD-10-CM

## 2014-04-12 DIAGNOSIS — R072 Precordial pain: Secondary | ICD-10-CM | POA: Diagnosis not present

## 2014-04-12 DIAGNOSIS — Z86711 Personal history of pulmonary embolism: Secondary | ICD-10-CM

## 2014-04-12 DIAGNOSIS — E785 Hyperlipidemia, unspecified: Secondary | ICD-10-CM | POA: Diagnosis not present

## 2014-04-12 DIAGNOSIS — R0789 Other chest pain: Secondary | ICD-10-CM

## 2014-04-12 DIAGNOSIS — R000 Tachycardia, unspecified: Secondary | ICD-10-CM

## 2014-04-12 LAB — GLUCOSE, CAPILLARY: GLUCOSE-CAPILLARY: 129 mg/dL — AB (ref 70–99)

## 2014-04-12 LAB — LIPID PANEL
Cholesterol: 189 mg/dL (ref 0–200)
HDL: 30 mg/dL — ABNORMAL LOW (ref >39)
LDL CALC: 94 mg/dL (ref 0–99)
TRIGLYCERIDES: 327 mg/dL — AB (ref <150)
Total CHOL/HDL Ratio: 6.3 RATIO
VLDL: 65 mg/dL — AB (ref 0–40)

## 2014-04-12 LAB — CBC
HCT: 41.1 % (ref 39.0–52.0)
HEMOGLOBIN: 13.5 g/dL (ref 13.0–17.0)
MCH: 28.2 pg (ref 26.0–34.0)
MCHC: 32.8 g/dL (ref 30.0–36.0)
MCV: 86 fL (ref 78.0–100.0)
Platelets: 286 10*3/uL (ref 150–400)
RBC: 4.78 MIL/uL (ref 4.22–5.81)
RDW: 13 % (ref 11.5–15.5)
WBC: 7.7 10*3/uL (ref 4.0–10.5)

## 2014-04-12 LAB — TSH

## 2014-04-12 MED ORDER — DIPHENHYDRAMINE HCL 50 MG/ML IJ SOLN
12.5000 mg | Freq: Three times a day (TID) | INTRAMUSCULAR | Status: AC
  Administered 2014-04-12 – 2014-04-13 (×3): 12.5 mg via INTRAVENOUS
  Filled 2014-04-12 (×3): qty 1

## 2014-04-12 MED ORDER — KETOROLAC TROMETHAMINE 30 MG/ML IJ SOLN
30.0000 mg | Freq: Three times a day (TID) | INTRAMUSCULAR | Status: AC
  Administered 2014-04-12 – 2014-04-13 (×3): 30 mg via INTRAVENOUS
  Filled 2014-04-12 (×3): qty 1

## 2014-04-12 MED ORDER — KETOROLAC TROMETHAMINE 30 MG/ML IJ SOLN
30.0000 mg | Freq: Three times a day (TID) | INTRAMUSCULAR | Status: DC
  Administered 2014-04-12: 30 mg via INTRAVENOUS
  Filled 2014-04-12: qty 1

## 2014-04-12 MED ORDER — VALPROATE SODIUM 500 MG/5ML IV SOLN
500.0000 mg | Freq: Once | INTRAVENOUS | Status: AC
  Administered 2014-04-12: 500 mg via INTRAVENOUS
  Filled 2014-04-12: qty 5

## 2014-04-12 MED ORDER — DIPHENHYDRAMINE HCL 50 MG/ML IJ SOLN
25.0000 mg | Freq: Three times a day (TID) | INTRAMUSCULAR | Status: DC
  Administered 2014-04-12: 25 mg via INTRAVENOUS
  Filled 2014-04-12: qty 1

## 2014-04-12 MED ORDER — METOCLOPRAMIDE HCL 5 MG/ML IJ SOLN
10.0000 mg | Freq: Three times a day (TID) | INTRAMUSCULAR | Status: DC
  Administered 2014-04-12: 10 mg via INTRAVENOUS
  Filled 2014-04-12: qty 2

## 2014-04-12 MED ORDER — PROCHLORPERAZINE EDISYLATE 5 MG/ML IJ SOLN
10.0000 mg | Freq: Three times a day (TID) | INTRAMUSCULAR | Status: AC
  Administered 2014-04-12 – 2014-04-13 (×3): 10 mg via INTRAVENOUS
  Filled 2014-04-12 (×3): qty 2

## 2014-04-12 MED ORDER — MAGNESIUM SULFATE 2 GM/50ML IV SOLN
2.0000 g | Freq: Once | INTRAVENOUS | Status: AC
  Administered 2014-04-12: 2 g via INTRAVENOUS
  Filled 2014-04-12: qty 50

## 2014-04-12 NOTE — Plan of Care (Signed)
Problem: Phase I Progression Outcomes Goal: Voiding-avoid urinary catheter unless indicated Outcome: Completed/Met Date Met:  04/12/14

## 2014-04-12 NOTE — Progress Notes (Signed)
  Echocardiogram 2D Echocardiogram has been performed.  Razan Siler 04/12/2014, 3:33 PM

## 2014-04-12 NOTE — Progress Notes (Signed)
PATIENT DETAILS Name: Harold Cohen Age: 33 y.o. Sex: male Date of Birth: 07-Feb-1981 Admit Date: 04/10/2014 Admitting Physician Lorretta HarpXilin Niu, MD ZOX:[id number],UJWJXPCP:SANDERS,ROBYN N, MD  Subjective: Still has a headache-with phonophobia/photophobia. Chest pain better.  Assessment/Plan: Principal Problem:   Complicated migraine:known hx of migraine-worsening headache for the past few days-with photophobia/phonophobia. Only briefly relieved by cocktail IV Toradol, Reglan and Benadryl.Started Topamax on 04/11/14. Have consulted Neuro.  Active Problems:   Chest Pain:clearly atypical-musculoskeletal-easily reproducible on palpations.Troponin so far negative. Per cards outpatient note- Stress Echo in 2013 negative. Repeat EKG today shows new t wave inversions, repeat Echo, consult cards.   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 CTA chest neg for PE,TSH pending. Defer further work up to the outpatient setting.[facility] Sharion Dove[facility]-and saw a cardiologist there that did not think patient had POTS (although I dont see documentation) Monitor in Telemetry   BJY:NWGNFAOZHYHTN:controlled, continue Metoprolol and Prazosin.   Psych issues: Including depression and PTSD. No suicidal or homicidal ideations on admission.Continue home meds: Seroquel and trazodone  ?Black tarry stools:none since admission, monitor CBC-if stable-no need for any inpatient work up    Dyslipidemia:c/w Statins  Disposition: Remain inpatient  Antibiotics:  None   Anti-infectives    None      DVT Prophylaxis: Prophylactic Heparin   Code Status: Full code   Family Communication Spouse at bedside 12/2  Procedures:  None  CONSULTS:  None  Time spent 40 minutes-which includes 50% of the time with face-to-face with patient/ family and coordinating care related to the above assessment and plan.  MEDICATIONS: Scheduled Meds: . antiseptic oral rinse  7 mL  Mouth Rinse BID  . atorvastatin  20 mg Oral QPM  . heparin  5,000 Units Subcutaneous 3 times per day  . hydrOXYzine  50 mg Oral Daily  . Influenza vac split quadrivalent PF  0.5 mL Intramuscular Tomorrow-1000  . metoprolol  50 mg Oral BID  . multivitamin with minerals  2 tablet Oral Daily  . pantoprazole  40 mg Oral Q1200  . prazosin  8 mg Oral QHS  . QUEtiapine  50 mg Oral QHS  . sodium chloride  3 mL Intravenous Q12H  . topiramate  50 mg Oral BID  . traZODone  100 mg Oral QHS  . Vitamin D (Ergocalciferol)  50,000 Units Oral Once per day on Mon Thu   Continuous Infusions:  PRN Meds:.ketorolac **AND** metoCLOPramide (REGLAN) injection **AND** diphenhydrAMINE, lidocaine, ondansetron **OR** ondansetron (ZOFRAN) IV, polyvinyl alcohol    PHYSICAL EXAM: Vital signs in last 24 hours: Filed Vitals:   04/11/14 0934 04/11/14 1949 04/12/14 0540 04/12/14 0825  BP: 137/72 113/65 124/72 133/74  Pulse:  84 75 87  Temp:  98.4 F (36.9 C) 97.5 F (36.4 C) 91.5 F (33.1 C)  TempSrc:  Oral Oral Oral  Resp: 14 16 16 16   Height:      Weight:   95.709 kg (211 lb)   SpO2: 98% 94% 97% 97%    Weight change: 5.897 kg (13 lb) Filed Weights   04/10/14 1543 04/11/14 0342 04/12/14 0540  Weight: 89.812 kg (198 lb) 95.89 kg (211 lb 6.4 oz) 95.709 kg (211 lb)   Body mass index is 28.61 kg/(m^2).   Gen Exam: Awake and alert with clear speech.   Neck: Supple, No JVD.   Chest: B/L Clear.   CVS: S1 S2 Regular, no murmurs.  Abdomen: soft, BS +, non tender, non distended.  Extremities: no edema, lower extremities warm to touch. Neurologic: Non Focal.   Skin: No Rash.   Wounds: N/A.   Intake/Output from previous day:  Intake/Output Summary (Last 24 hours) at 04/12/14 1008 Last data filed at 04/12/14 0955  Gross per 24 hour  Intake      3 ml  Output   2100 ml  Net  -2097 ml     LAB RESULTS: CBC  Recent Labs Lab 04/10/14 1550 04/12/14 0353  WBC 8.5 7.7  HGB 15.5 13.5  HCT 45.7 41.1    PLT 309 286  MCV 85.3 86.0  MCH 28.9 28.2  MCHC 33.9 32.8  RDW 13.0 13.0    Chemistries   Recent Labs Lab 04/10/14 1550  NA 141  K 4.0  CL 101  CO2 25  GLUCOSE 127*  BUN 10  CREATININE 1.00  CALCIUM 9.8    CBG:  Recent Labs Lab 04/11/14 0746 04/12/14 0749  GLUCAP 101* 129*    GFR Estimated Creatinine Clearance: 124.8 mL/min (by C-G formula based on Cr of 1).  Coagulation profile No results for input(s): INR, PROTIME in the last 168 hours.  Cardiac Enzymes  Recent Labs Lab 04/11/14 0543 04/11/14 1130 04/11/14 2138  TROPONINI <0.30 <0.30 <0.30    Invalid input(s): POCBNP  Recent Labs  04/10/14 1550  DDIMER <0.27   No results for input(s): HGBA1C in the last 72 hours.  Recent Labs  04/12/14 0353  CHOL 189  HDL 30*  LDLCALC 94  TRIG 161327*  CHOLHDL 6.3   No results for input(s): TSH, T4TOTAL, T3FREE, THYROIDAB in the last 72 hours.  Invalid input(s): FREET3 No results for input(s): VITAMINB12, FOLATE, FERRITIN, TIBC, IRON, RETICCTPCT in the last 72 hours.  Recent Labs  04/11/14 0543  LIPASE 24    Urine Studies No results for input(s): UHGB, CRYS in the last 72 hours.  Invalid input(s): UACOL, UAPR, USPG, UPH, UTP, UGL, UKET, UBIL, UNIT, UROB, ULEU, UEPI, UWBC, URBC, UBAC, CAST, UCOM, BILUA  MICROBIOLOGY: No results found for this or any previous visit (from the past 240 hour(s)).  RADIOLOGY STUDIES/RESULTS: Dg Chest 2 View  04/10/2014   CLINICAL DATA:  Left-sided chest pain with shortness of breath for the past 2 days; left lower leg pain for 2 weeks; history of DVT and pulmonary embolism  EXAM: CHEST  2 VIEW  COMPARISON:  PA and lateral chest of May 17, 2013  FINDINGS: The lungs are adequately inflated. There is no focal infiltrate. The heart and pulmonary vascularity are normal. The mediastinum is normal in width. There is no pleural effusion. The bony thorax is unremarkable.  IMPRESSION: There is no active cardiopulmonary  disease.   Electronically Signed   By: David  [place]   On: 04/10/2014 16:47   Ct Angio Chest Pe W/cm &/or Wo Cm  04/10/2014   CLINICAL DATA:  33 year old male with left chest pain and shortness of breath. Initial encounter.  EXAM: CT ANGIOGRAPHY CHEST WITH CONTRAST  TECHNIQUE: Multidetector CT imaging of the chest was performed using the standard protocol during bolus administration of intravenous contrast. Multiplanar CT image reconstructions and MIPs were obtained to evaluate the vascular anatomy.  CONTRAST:  100mL OMNIPAQUE IOHEXOL 350 MG/ML SOLN  COMPARISON:  04/10/2014 and prior chest radiographs.  FINDINGS: This is a technically satisfactory study.  No pulmonary emboli identified.  There is no evidence of thoracic aortic aneurysm is or definite dissection.  Upper limits normal heart  size noted.  There is no evidence of pleural/ pericardial effusion or enlarged lymph nodes.  Minimal bibasilar scarring again noted.  There is no evidence of airspace disease, consolidation, nodule, mass or endobronchial/endotracheal lesion.  There is no evidence of pneumothorax.  No acute or suspicious bony abnormalities are identified.  Fatty infiltration of the visualized liver is noted.  Review of the MIP images confirms the above findings.  IMPRESSION: No evidence of acute abnormality. No evidence of pulmonary emboli or thoracic aortic aneurysm/ definite dissection.  Fatty infiltration of the liver.   Electronically Signed   By: Laveda Abbe M.D.   On: 04/10/2014 19:35   Ct Renal Stone Study  04/10/2014   CLINICAL DATA:  LEFT lower quadrant pain for 2 days.  Kidney stone.  EXAM: CT ABDOMEN AND PELVIS WITHOUT CONTRAST  TECHNIQUE: Multidetector CT imaging of the abdomen and pelvis was performed following the standard protocol without IV contrast.  COMPARISON:  09/30/2011.  FINDINGS: Musculoskeletal:  Normal.  Lung Bases: Atelectasis.  Liver: Hepatosteatosis with borderline hepatomegaly. Liver span is 18.8 cm.  Spleen:  Normal.   Gallbladder:  Contracted.  Common bile duct:  Normal.  Pancreas:  Normal.  Adrenal glands:  Normal bilaterally.  Kidneys: Normal. No stones or hydronephrosis. Both ureters appear within normal limits.  Stomach:  Normal.  Small bowel:  Normal.  Colon: Normal appendix. Normal rectosigmoid. No inflammatory changes of colon.  Pelvic Genitourinary:  Collapsed.  Peritoneum: No free air or free fluid.  No mesenteric adenopathy.  Vasculature: Normal.  Body Wall: Fat containing periumbilical hernia.  IMPRESSION: 1. No acute abnormality. 2. Hepatosteatosis.   Electronically Signed   By: Andreas Newport M.D.   On: 04/10/2014 17:38    Jeoffrey Massed, MD  [facility] Pager:[phone]  If 7PM-7AM, please contact night-coverage [email or web address] Password [email or web address] 04/12/2014, 10:08 AM   LOS: 2 days

## 2014-04-12 NOTE — Plan of Care (Signed)
Problem: Phase I Progression Outcomes Goal: Pain controlled with appropriate interventions Outcome: Progressing Goal: OOB as tolerated unless otherwise ordered Outcome: Progressing Goal: Hemodynamically stable Outcome: Completed/Met Date Met:  04/12/14

## 2014-04-12 NOTE — Progress Notes (Signed)
Reason for Consult:   Chest pain  Requesting Physician: [facility]  HPI:  33 year old male who is a war veteran. He gets his care at the [facility] in [place]. He saw Dr Rennis GoldenHilty in Nov 2014. His past medical history includes bilateral pulmonary emboli of unknown etiology. This was in 2010 in [place] from [place]. He tested positive for factor V Leiden then, however it is unclear if this was during anticoagulation or not. He saw a hematologist in the past who felt that he did not need lifelong anticoagulation and currently he is no longer on any blood thinners. He has persistent tachycardia with variable rates, some associated anxiety and fatigue. He does have posttraumatic stress disorder and a 70% service connected at the [facility]. He saw a cardiologist (Dr. Cloria SpringMichael Kutcher) in [place] at [facility] in 2013, after an echocardiogram demonstrated an elevated LVOT gradient, which was concerning for possible HOCM. Subsequently it was felt that this was actually an intracavitary gradient secondary to a hyperdynamic ventricle. He also underwent a stress echocardiogram in 2013 which was negative for ischemic induced wall motion abnormalities. A cardiac MRI was scheduled however not performed due to it not being covered by the [facility]. He is been on a number of treatments including medications for his PTSD, neuropathic pain medicines, and selective beta blockers which have not managed to help with his tachycardia. Dr Rennis GoldenHilty referred him to Dr Alden Hippaubert at [facility] but the pt never went.                He was admitted 04/10/14 with a complex migraine and has also had some chest pain. His Troponin has been negative x 3. His EKG was abnormal with Lat TWI but he also has LVH. An echo is pending. His pain is described to me as "sharp" and worse with deep inspiration. CTA was negative for PE.   PMHx:  Past Medical History  Diagnosis Date  . Pulmonary embolism   . Pleural effusion    . PTSD (post-traumatic stress disorder)   . Migraines   . Depression   . Anxiety   . Insomnia   . Clotting disorder   . Heart murmur   . Hyperlipidemia   . Hypertension   . Shortness of breath   . GERD (gastroesophageal reflux disease)   . TBI (traumatic brain injury) 2010    History reviewed. No pertinent past surgical history.  SOCHx:  reports that he has never smoked. He has never used smokeless tobacco. He reports that he drinks about 1.2 oz of alcohol per week. He reports that he does not use illicit drugs.  FAMHx: Family History  Problem Relation Age of Onset  . Coronary artery disease      ALLERGIES: No Known Allergies  ROS: Pertinent items are noted in HPI. See H&P for complete ROS  HOME MEDICATIONS: Prior to Admission medications   Medication Sig Start Date End Date Taking? Authorizing Provider  atorvastatin (LIPITOR) 20 MG tablet Take 20 mg by mouth every evening.    Yes Historical Provider, MD  ergocalciferol (VITAMIN D2) 50000 UNITS capsule Take 50,000 Units by mouth 2 (two) times a week. Taken on Tuesdays and Thursdays.   Yes Historical Provider, MD  hydroxypropyl methylcellulose (ISOPTO TEARS) 2.5 % ophthalmic solution Place 2 drops into both eyes as needed for dry eyes.   Yes Historical Provider, MD  HYDROXYZINE PAMOATE PO Take 50 mg by mouth daily.  Yes Historical Provider, MD  ibuprofen (ADVIL,MOTRIN) 600 MG tablet Take 1 tablet (600 mg total) by mouth every 8 (eight) hours as needed. Take with food. 05/17/13  Yes Suzi Roots, MD  lidocaine (LMX) 4 % cream Apply 1 application topically 3 (three) times daily as needed (anal fissures).   Yes Historical Provider, MD  metoprolol (LOPRESSOR) 100 MG tablet Take 50 mg by mouth 2 (two) times daily.    Yes Historical Provider, MD  Multiple Vitamin (MULTIVITAMIN) tablet Take 2 tablets by mouth daily.   Yes Historical Provider, MD  omeprazole (PRILOSEC) 40 MG capsule Take 40 mg by mouth daily.   Yes Historical  Provider, MD  prazosin (MINIPRESS) 2 MG capsule Take 8 mg by mouth at bedtime.   Yes Historical Provider, MD  QUEtiapine (SEROQUEL) 25 MG tablet Take 2 tablets (50 mg total) by mouth at bedtime. 09/30/11  Yes Simbiso Ranga, MD  traZODone (DESYREL) 100 MG tablet Take 100 mg by mouth at bedtime.   Yes Historical Provider, MD    HOSPITAL MEDICATIONS: I have reviewed the patient's current medications.  VITALS: Blood pressure 117/71, pulse 81, temperature 97.5 F (36.4 C), temperature source Oral, resp. rate 16, height 6' (1.829 m), weight 211 lb (95.709 kg), SpO2 97 %.  PHYSICAL EXAM: General appearance: alert, cooperative and no distress Neck: no carotid bruit and no JVD Lungs: clear to auscultation bilaterally Heart: regular rate and rhythm Abdomen: soft, non-tender; bowel sounds normal; no masses,  no organomegaly Extremities: extremities normal, atraumatic, no cyanosis or edema Pulses: 2+ and symmetric Skin: Skin color, texture, turgor normal. No rashes or lesions Neurologic: Grossly normal  LABS: Results for orders placed or performed during the hospital encounter of 04/10/14 (from the past 24 hour(s))  Troponin I (q 6hr x 3)     Status: None   Collection Time: 04/11/14  9:38 PM  Result Value Ref Range   Troponin I <0.30 <0.30 ng/mL  Lipid panel     Status: Abnormal   Collection Time: 04/12/14  3:53 AM  Result Value Ref Range   Cholesterol 189 0 - 200 mg/dL   Triglycerides 119 (H) <150 mg/dL   HDL 30 (L) >14 mg/dL   Total CHOL/HDL Ratio 6.3 RATIO   VLDL 65 (H) 0 - 40 mg/dL   LDL Cholesterol 94 0 - 99 mg/dL  CBC     Status: None   Collection Time: 04/12/14  3:53 AM  Result Value Ref Range   WBC 7.7 4.0 - 10.5 K/uL   RBC 4.78 4.22 - 5.81 MIL/uL   Hemoglobin 13.5 13.0 - 17.0 g/dL   HCT 78.2 95.6 - 21.3 %   MCV 86.0 78.0 - 100.0 fL   MCH 28.2 26.0 - 34.0 pg   MCHC 32.8 30.0 - 36.0 g/dL   RDW 08.6 57.8 - 46.9 %   Platelets 286 150 - 400 K/uL  Glucose, capillary      Status: Abnormal   Collection Time: 04/12/14  7:49 AM  Result Value Ref Range   Glucose-Capillary 129 (H) 70 - 99 mg/dL   Comment 1 Documented in Chart    Comment 2 Notify RN     EKG: NSR TWI lat leads, LVH  IMAGING: Dg Chest 2 View 04/10/2014  IMPRESSION: There is no active cardiopulmonary disease.     Ct Angio Chest Pe W/cm &/or Wo Cm  04/10/2014   CLINICAL DATA:  33 year old male with left chest pain and shortness of breath. Initial encounter.  IMPRESSION: No  evidence of acute abnormality. No evidence of pulmonary emboli or thoracic aortic aneurysm/ definite dissection.  Fatty infiltration of the liver.    Ct Renal Stone Study  04/10/2014   CLINICAL DATA:  LEFT lower quadrant pain for 2 days.  Kidney stone.   IMPRESSION: 1. No acute abnormality. 2. Hepatosteatosis.       IMPRESSION: Principal Problem:   Complicated migraine Active Problems:   Chest pain   Hypertensive cardiovascular disease   Abnormal EKG-LVH, TWI   Hx pulmonary embolism- 2010   Hyperlipidemia   Tachycardia   Depression   RECOMMENDATION: Dr Delton SeeNelson to review echo- further recommendations to follow.   Time Spent Directly with Patient: 45 minutes  Abelino DerrickKILROY,LUKE K [phone] beeper 04/12/2014, 1:04 PM    The patient was seen, examined and discussed with Corine ShelterLuke Kilroy, PA-C and I agree with the above.   A 33 year old male with atypical non-exertion, constant sharp chest pain with ? Prior dg of hypertrophic cardiomyopathy, unable to get MRI in the past.  Enzymes are negative, ECG has non-specific negative T waves in the lateral leads, unchanged from January 2015.  I think the best option would be to get a cardiac MRI to evaluate for possible pericarditis and to evaluate for HCM and LVOT vs mid-cavitary obstruction. No h/o syncope. The pain is very atypical for CAD and he has had a negative stress test in 2013.  Lars MassonELSON, Cayde Held H 04/12/2014

## 2014-04-12 NOTE — Consult Note (Signed)
NEURO HOSPITALIST CONSULT NOTE    Reason for Consult: HA  HPI:                                                                                                                                          Harold Cohen is an 33 y.o. male with known history of Migraine HA, positive for factor V leiden and states he has HA about once a week.  He usually will take OTC NSAID which will relieve his pain.  He was admitted to the hospital after he noted left leg pain (negtive LE doppler) which then became left leg and chest pain. On arrival to hospital he noted bilateral retro-orbital stabbing pain that was constant. He states this is not typical for his normal HA.  He received Toradol, Reglan and Benadryl on admission which he states helped for about one hour but then the HA returned. Currently he is resting comfortable in bed but states he is suffering from a 8/10 HA. HE aso is endorsing blurred vision.  The vision does not change if he closes each eye and he denies diplopia. He denies any numbness, tingling or weakness.   Past Medical History  Diagnosis Date  . Pulmonary embolism   . Pleural effusion   . PTSD (post-traumatic stress disorder)   . Migraines   . Depression   . Anxiety   . Insomnia   . Clotting disorder   . Heart murmur   . Hyperlipidemia   . Hypertension   . Shortness of breath   . GERD (gastroesophageal reflux disease)   . TBI (traumatic brain injury) 2010  . Factor V Leiden     History reviewed. No pertinent past surgical history.  Family History  Problem Relation Age of Onset  . Coronary artery disease       Social History:  reports that he has never smoked. He has never used smokeless tobacco. He reports that he drinks about 1.2 oz of alcohol per week. He reports that he does not use illicit drugs.  No Known Allergies  MEDICATIONS:                                                                                                                      Scheduled: . antiseptic oral  rinse  7 mL Mouth Rinse BID  . atorvastatin  20 mg Oral QPM  . ketorolac  30 mg Intravenous 3 times per day   And  . metoCLOPramide (REGLAN) injection  10 mg Intravenous 3 times per day   And  . diphenhydrAMINE  25 mg Intravenous 3 times per day  . heparin  5,000 Units Subcutaneous 3 times per day  . hydrOXYzine  50 mg Oral Daily  . metoprolol  50 mg Oral BID  . multivitamin with minerals  2 tablet Oral Daily  . pantoprazole  40 mg Oral Q1200  . prazosin  8 mg Oral QHS  . QUEtiapine  50 mg Oral QHS  . sodium chloride  3 mL Intravenous Q12H  . topiramate  50 mg Oral BID  . traZODone  100 mg Oral QHS  . Vitamin D (Ergocalciferol)  50,000 Units Oral Once per day on Mon Thu     ROS:                                                                                                                                       History obtained from the patient  General ROS: negative for - chills, fatigue, fever, night sweats, weight gain or weight loss Psychological ROS: negative for - behavioral disorder, hallucinations, memory difficulties, mood swings or suicidal ideation Ophthalmic ROS: negative for - blurry vision, double vision, eye pain or loss of vision ENT ROS: negative for - epistaxis, nasal discharge, oral lesions, sore throat, tinnitus or vertigo Allergy and Immunology ROS: negative for - hives or itchy/watery eyes Hematological and Lymphatic ROS: negative for - bleeding problems, bruising or swollen lymph nodes Endocrine ROS: negative for - galactorrhea, hair pattern changes, polydipsia/polyuria or temperature intolerance Respiratory ROS: negative for - cough, hemoptysis, shortness of breath or wheezing Cardiovascular ROS: negative for - chest pain, dyspnea on exertion, edema or irregular heartbeat Gastrointestinal ROS: negative for - abdominal pain, diarrhea, hematemesis, nausea/vomiting or stool incontinence Genito-Urinary ROS: negative for - dysuria,  hematuria, incontinence or urinary frequency/urgency Musculoskeletal ROS: negative for - joint swelling or muscular weakness Neurological ROS: as noted in HPI Dermatological ROS: negative for rash and skin lesion changes   Blood pressure 117/71, pulse 81, temperature 97.5 F (36.4 C), temperature source Oral, resp. rate 16, height 6' (1.829 m), weight 95.709 kg (211 lb), SpO2 97 %.   Neurologic Examination:  Physical Exam  Constitutional: He appears well-developed and well-nourished.  Psych: Affect appropriate to situation Eyes: No scleral injection HENT: No OP obstrucion Head: Normocephalic.  Cardiovascular: Normal rate and regular rhythm.  Respiratory: Effort normal and breath sounds normal.  GI: Soft. Bowel sounds are normal. No distension. There is no tenderness.  Skin: WDI  Neuro Exam: Mental Status: Alert, oriented, thought content appropriate.  Speech fluent without evidence of aphasia.  Able to follow 3 step commands without difficulty. Cranial Nerves: II: Discs flat bilaterally; Visual fields grossly normal, pupils equal, round, reactive to light and accommodation III,IV, VI: ptosis not present, extra-ocular motions intact bilaterally V,VII: smile symmetric, facial light touch sensation normal bilaterally VIII: hearing normal bilaterally IX,X: gag reflex present XI: bilateral shoulder shrug XII: midline tongue extension without atrophy or fasciculations  Motor: Right : Upper extremity   5/5    Left:     Upper extremity   4/5  Lower extremity   5/5     Lower extremity   4/5 Tone and bulk:normal tone throughout; no atrophy noted Sensory: Pinprick and light touch intact throughout, bilaterally Deep Tendon Reflexes:  Right: Upper Extremity   Left: Upper extremity   biceps (C-5 to C-6) 2/4   biceps (C-5 to C-6) 2/4 tricep (C7) 2/4    triceps (C7) 2/4 Brachioradialis (C6)  2/4  Brachioradialis (C6) 2/4  Lower Extremity Lower Extremity  quadriceps (L-2 to L-4) 2/4   quadriceps (L-2 to L-4) 2/4 Achilles (S1) 2/4   Achilles (S1) 2/4  Plantars: Right: downgoing   Left: downgoing Cerebellar: normal finger-to-nose,  normal heel-to-shin test Gait: not tested due to multiple leads.  CV: pulses palpable throughout    Lab Results: Basic Metabolic Panel:  Recent Labs Lab 04/10/14 1550  NA 141  K 4.0  CL 101  CO2 25  GLUCOSE 127*  BUN 10  CREATININE 1.00  CALCIUM 9.8    Liver Function Tests:  Recent Labs Lab 04/10/14 1550  AST 23  ALT 22  ALKPHOS 62  BILITOT <0.2*  PROT 8.0  ALBUMIN 4.1    Recent Labs Lab 04/11/14 0543  LIPASE 24   No results for input(s): AMMONIA in the last 168 hours.  CBC:  Recent Labs Lab 04/10/14 1550 04/12/14 0353  WBC 8.5 7.7  HGB 15.5 13.5  HCT 45.7 41.1  MCV 85.3 86.0  PLT 309 286    Cardiac Enzymes:  Recent Labs Lab 04/10/14 1550 04/10/14 2143 04/11/14 0543 04/11/14 1130 04/11/14 2138  TROPONINI <0.30 <0.30 <0.30 <0.30 <0.30    Lipid Panel:  Recent Labs Lab 04/12/14 0353  CHOL 189  TRIG 327*  HDL 30*  CHOLHDL 6.3  VLDL 65*  LDLCALC 94    CBG:  Recent Labs Lab 04/11/14 0746 04/12/14 0749  GLUCAP 101* 129*    Microbiology: No results found for this or any previous visit.  Coagulation Studies: No results for input(s): LABPROT, INR in the last 72 hours.  Imaging: Dg Chest 2 View  04/10/2014   CLINICAL DATA:  Left-sided chest pain with shortness of breath for the past 2 days; left lower leg pain for 2 weeks; history of DVT and pulmonary embolism  EXAM: CHEST  2 VIEW  COMPARISON:  PA and lateral chest of May 17, 2013  FINDINGS: The lungs are adequately inflated. There is no focal infiltrate. The heart and pulmonary vascularity are normal. The mediastinum is normal in width. There is no pleural effusion. The bony thorax is unremarkable.  IMPRESSION: There is  no active  cardiopulmonary disease.   Electronically Signed   By: David  SwazilandJordan   On: 04/10/2014 16:47   Ct Angio Chest Pe W/cm &/or Wo Cm  04/10/2014   CLINICAL DATA:  33 year old male with left chest pain and shortness of breath. Initial encounter.  EXAM: CT ANGIOGRAPHY CHEST WITH CONTRAST  TECHNIQUE: Multidetector CT imaging of the chest was performed using the standard protocol during bolus administration of intravenous contrast. Multiplanar CT image reconstructions and MIPs were obtained to evaluate the vascular anatomy.  CONTRAST:  100mL OMNIPAQUE IOHEXOL 350 MG/ML SOLN  COMPARISON:  04/10/2014 and prior chest radiographs.  FINDINGS: This is a technically satisfactory study.  No pulmonary emboli identified.  There is no evidence of thoracic aortic aneurysm is or definite dissection.  Upper limits normal heart size noted.  There is no evidence of pleural/ pericardial effusion or enlarged lymph nodes.  Minimal bibasilar scarring again noted.  There is no evidence of airspace disease, consolidation, nodule, mass or endobronchial/endotracheal lesion.  There is no evidence of pneumothorax.  No acute or suspicious bony abnormalities are identified.  Fatty infiltration of the visualized liver is noted.  Review of the MIP images confirms the above findings.  IMPRESSION: No evidence of acute abnormality. No evidence of pulmonary emboli or thoracic aortic aneurysm/ definite dissection.  Fatty infiltration of the liver.   Electronically Signed   By: Laveda AbbeJeff  Hu M.D.   On: 04/10/2014 19:35   Ct Renal Stone Study  04/10/2014   CLINICAL DATA:  LEFT lower quadrant pain for 2 days.  Kidney stone.  EXAM: CT ABDOMEN AND PELVIS WITHOUT CONTRAST  TECHNIQUE: Multidetector CT imaging of the abdomen and pelvis was performed following the standard protocol without IV contrast.  COMPARISON:  09/30/2011.  FINDINGS: Musculoskeletal:  Normal.  Lung Bases: Atelectasis.  Liver: Hepatosteatosis with borderline hepatomegaly. Liver span is 18.8 cm.   Spleen:  Normal.  Gallbladder:  Contracted.  Common bile duct:  Normal.  Pancreas:  Normal.  Adrenal glands:  Normal bilaterally.  Kidneys: Normal. No stones or hydronephrosis. Both ureters appear within normal limits.  Stomach:  Normal.  Small bowel:  Normal.  Colon: Normal appendix. Normal rectosigmoid. No inflammatory changes of colon.  Pelvic Genitourinary:  Collapsed.  Peritoneum: No free air or free fluid.  No mesenteric adenopathy.  Vasculature: Normal.  Body Wall: Fat containing periumbilical hernia.  IMPRESSION: 1. No acute abnormality. 2. Hepatosteatosis.   Electronically Signed   By: Andreas NewportGeoffrey  Lamke M.D.   On: 04/10/2014 17:38       Assessment and plan per attending neurologist  Felicie Mornavid Smith PA-C [facility] [phone]  04/12/2014, 2:01 PM   Assessment/Plan: 33 YO male with known Migraine HA presenting to hospital with left leg pain, CP and HA. While hospitalized his HA has not resolved other than for brief period after receiving a migraine cocktail. His description continues to sound most consistent with migraine and he has a history of migraine with left sided weakness previously(06/2012). I would favor continuing to treat as migraine at this time but would also favor MRI to evaluate the left sided weakness.   Recommend: 1) Depakote 500 mg once now 2) Magnesium 2gm IV x 1 3) Toradol, compazine and Benadryl Q8 hours for three doses.  4) MRI brain.  5) Would d/c topamax as can worsen depression and he does not meet criteria for prophylactic(> 4 per month) 6) If no improvement, could consider a brief course of gabapentin or medrol dose pack.   Corliss BlackerMcNeill  Amada JupiterKirkpatrick, MD [facility] [phone]  If 7pm- 7am, please page neurology on call as listed in AMION.

## 2014-04-12 NOTE — Progress Notes (Signed)
UR completed 

## 2014-04-13 ENCOUNTER — Other Ambulatory Visit: Payer: Self-pay | Admitting: Physician Assistant

## 2014-04-13 DIAGNOSIS — F329 Major depressive disorder, single episode, unspecified: Secondary | ICD-10-CM

## 2014-04-13 DIAGNOSIS — R9431 Abnormal electrocardiogram [ECG] [EKG]: Secondary | ICD-10-CM

## 2014-04-13 DIAGNOSIS — R071 Chest pain on breathing: Secondary | ICD-10-CM

## 2014-04-13 DIAGNOSIS — R072 Precordial pain: Secondary | ICD-10-CM

## 2014-04-13 DIAGNOSIS — R0789 Other chest pain: Secondary | ICD-10-CM

## 2014-04-13 LAB — GLUCOSE, CAPILLARY: Glucose-Capillary: 87 mg/dL (ref 70–99)

## 2014-04-13 NOTE — Progress Notes (Signed)
Subjective: Feeling better.  Continues to have mild CP worse with inspiration.    Objective: Vital signs in last 24 hours: Temp:  [97.6 F (36.4 C)-98.2 F (36.8 C)] 98.2 F (36.8 C) (12/04 0828) Pulse Rate:  [70-87] 83 (12/04 0828) Resp:  [16-19] 16 (12/04 0828) BP: (117-152)/(62-86) 152/86 mmHg (12/04 0828) SpO2:  [95 %-98 %] 98 % (12/04 0828) Weight:  [213 lb 3 oz (96.7 kg)] 213 lb 3 oz (96.7 kg) (12/04 0405) Last BM Date: 04/10/14  Intake/Output from previous day: 12/03 0701 - 12/04 0700 In: 243 [P.O.:240; I.V.:3] Out: 1951 [Urine:1950; Stool:1] Intake/Output this shift:    Medications Current Facility-Administered Medications  Medication Dose Route Frequency Provider Last Rate Last Dose  . antiseptic oral rinse (CPC / CETYLPYRIDINIUM CHLORIDE 0.05%) solution 7 mL  7 mL Mouth Rinse BID Maretta BeesShanker M Ghimire, MD   7 mL at 04/12/14 2302  . atorvastatin (LIPITOR) tablet 20 mg  20 mg Oral QPM Lorretta HarpXilin Niu, MD   20 mg at 04/12/14 1816  . heparin injection 5,000 Units  5,000 Units Subcutaneous 3 times per day Lorretta HarpXilin Niu, MD   5,000 Units at 04/13/14 0631  . hydrOXYzine (VISTARIL) capsule 50 mg  50 mg Oral Daily Lorretta HarpXilin Niu, MD   50 mg at 04/12/14 0948  . lidocaine (LMX) 4 % cream 1 application  1 application Topical TID PRN Lorretta HarpXilin Niu, MD      . metoprolol (LOPRESSOR) tablet 50 mg  50 mg Oral BID Lorretta HarpXilin Niu, MD   50 mg at 04/12/14 2259  . multivitamin with minerals tablet 2 tablet  2 tablet Oral Daily Lorretta HarpXilin Niu, MD   2 tablet at 04/12/14 [phone]  . ondansetron (ZOFRAN) tablet 4 mg  4 mg Oral Q6H PRN Lorretta HarpXilin Niu, MD       Or  . ondansetron [facility](ZOFRAN) injection 4 mg  4 mg Intravenous Q6H PRN Lorretta HarpXilin Niu, MD      . pantoprazole (PROTONIX) EC tablet 40 mg  40 mg Oral Q1200 Lorretta HarpXilin Niu, MD   40 mg at 04/12/14 1143  . polyvinyl alcohol (LIQUIFILM TEARS) 1.4 % ophthalmic solution 2 drop  2 drop Both Eyes PRN Lorretta HarpXilin Niu, MD      . prazosin (MINIPRESS) capsule 8 mg  8 mg Oral QHS Lorretta HarpXilin Niu, MD   8 mg at  04/12/14 2300  . QUEtiapine (SEROQUEL) tablet 50 mg  50 mg Oral QHS Lorretta HarpXilin Niu, MD   50 mg at 04/12/14 2300  . sodium chloride 0.9 % injection 3 mL  3 mL Intravenous Q12H Lorretta HarpXilin Niu, MD   3 mL at 04/12/14 2302  . traZODone (DESYREL) tablet 100 mg  100 mg Oral QHS Lorretta HarpXilin Niu, MD   100 mg at 04/12/14 2259  . Vitamin D (Ergocalciferol) (DRISDOL) capsule 50,000 Units  50,000 Units Oral Once per day on Mon Thu Xilin Niu, MD   50,000 Units at 04/12/14 0806    PE: General appearance: alert, cooperative and no distress Lungs: clear to auscultation bilaterally Heart: regular rate and rhythm, S1, S2 normal, no murmur, click, rub or gallop Extremities: No LEE Pulses: 2+ and symmetric Skin: warm and dry Neurologic: Grossly normal  Lab Results:   Recent Labs  04/10/14 1550 04/12/14 0353  WBC 8.5 7.7  HGB 15.5 13.5  HCT 45.7 41.1  PLT 309 286   BMET  Recent Labs  04/10/14 1550  NA 141  K 4.0  CL 101  CO2 25  GLUCOSE 127*  BUN 10  CREATININE 1.00  CALCIUM 9.8   PT/INR No results for input(s): LABPROT, INR in the last 72 hours. Cholesterol  Recent Labs  04/12/14 0353  CHOL 189   Cardiac Enzymes Invalid input(s): TROPONIN,  CKMB  Studies/Results: @RISRSLT2 @   Assessment/Plan  A 33 year old male with atypical non-exertion, constant sharp chest pain with ? Prior dg of hypertrophic cardiomyopathy, unable to get MRI in the past.  Enzymes are negative, ECG has non-specific negative T waves in the lateral leads, unchanged from January 2015. Marland Kitchen.   Principal Problem:   Complicated migraine Active Problems:   Hx pulmonary embolism- 2010   Hypertensive cardiovascular disease   Hyperlipidemia   Tachycardia   Chest pain   Depression   Abnormal EKG-LVH, TWI  Plan:  Mild CP worse with inspiration.  No rub on exam.  Ambulated yesterday without problems.  Negative MI.  HR controlled. Echocardiogram reveals EF of 65-70%. Mild concentric hypertrophy. G1DD.  No DVT by dopplers.   Negative brain MRI.  BP elevated this morning but overall has been controlled last 24 hours.  On lopressor 50mg  BID.     Dr. Delton SeeNelson recommended MRI to evaluate for possible pericarditis and to evaluate for HCM and LVOT vs mid-cavitary obstruction.     LOS: 3 days    HAGER, BRYAN PA-C 04/13/2014 8:36 AM As above, patient seen and examined. The patient presently denies chest pain or dyspnea. Previous symptoms increased with inspiration. CT showed no pulmonary embolus. Echocardiogram shows preserved LV function and no effusion. Enzymes negative. Patient can be discharged from a cardiac standpoint. We will try and arrange an outpatient cardiac MRI to R/O HOCM and then follow-up with Dr. Rennis GoldenHilty. Olga MillersBrian Chrisopher Pustejovsky

## 2014-04-13 NOTE — Progress Notes (Addendum)
Subjective: No HA today  Objective: Current vital signs: BP 152/86 mmHg  Pulse 83  Temp(Src) 98.2 F (36.8 C) (Oral)  Resp 16  Ht 6' (1.829 m)  Wt 96.7 kg (213 lb 3 oz)  BMI 28.91 kg/m2  SpO2 98% Vital signs in last 24 hours: Temp:  [97.6 F (36.4 C)-98.2 F (36.8 C)] 98.2 F (36.8 C) (12/04 0828) Pulse Rate:  [70-87] 83 (12/04 0828) Resp:  [16-19] 16 (12/04 0828) BP: (117-152)/(62-86) 152/86 mmHg (12/04 0828) SpO2:  [95 %-98 %] 98 % (12/04 0828) Weight:  [96.7 kg (213 lb 3 oz)] 96.7 kg (213 lb 3 oz) (12/04 0405)  Intake/Output from previous day: 12/03 0701 - 12/04 0700 In: 243 [P.O.:240; I.V.:3] Out: 1951 [Urine:1950; Stool:1] Intake/Output this shift:   Nutritional status: Diet Heart  Neurologic Exam: General: Mental Status: Alert, oriented, thought content appropriate.  Speech fluent without evidence of aphasia.  Able to follow 3 step commands without difficulty. Cranial Nerves: II: Discs flat bilaterally; Visual fields grossly normal, pupils equal, round, reactive to light and accommodation III,IV, VI: ptosis not present, extra-ocular motions intact bilaterally V,VII: smile symmetric, facial light touch sensation normal bilaterally VIII: hearing normal bilaterally IX,X: gag reflex present XI: bilateral shoulder shrug XII: midline tongue extension without atrophy or fasciculations  Motor: Right : Upper extremity   5/5    Left:     Upper extremity   5/5  Lower extremity   5/5     Lower extremity   5/5 Tone and bulk:normal tone throughout; no atrophy noted Sensory: Pinprick and light touch intact throughout, bilaterally Deep Tendon Reflexes:  Right: Upper Extremity   Left: Upper extremity   biceps (C-5 to C-6) 2/4   biceps (C-5 to C-6) 2/4 tricep (C7) 2/4    triceps (C7) 2/4 Brachioradialis (C6) 2/4  Brachioradialis (C6) 2/4  Lower Extremity Lower Extremity  quadriceps (L-2 to L-4) 2/4   quadriceps (L-2 to L-4) 2/4 Achilles (S1) 2/4   Achilles (S1)  2/4  Plantars: Right: downgoing   Left: downgoing Cerebellar: normal finger-to-nose,  normal heel-to-shin test    Lab Results: Basic Metabolic Panel:  Recent Labs Lab 04/10/14 1550  NA 141  K 4.0  CL 101  CO2 25  GLUCOSE 127*  BUN 10  CREATININE 1.00  CALCIUM 9.8    Liver Function Tests:  Recent Labs Lab 04/10/14 1550  AST 23  ALT 22  ALKPHOS 62  BILITOT <0.2*  PROT 8.0  ALBUMIN 4.1    Recent Labs Lab 04/11/14 0543  LIPASE 24   No results for input(s): AMMONIA in the last 168 hours.  CBC:  Recent Labs Lab 04/10/14 1550 04/12/14 0353  WBC 8.5 7.7  HGB 15.5 13.5  HCT 45.7 41.1  MCV 85.3 86.0  PLT 309 286    Cardiac Enzymes:  Recent Labs Lab 04/10/14 1550 04/10/14 2143 04/11/14 0543 04/11/14 1130 04/11/14 2138  TROPONINI <0.30 <0.30 <0.30 <0.30 <0.30    Lipid Panel:  Recent Labs Lab 04/12/14 0353  CHOL 189  TRIG 327*  HDL 30*  CHOLHDL 6.3  VLDL 65*  LDLCALC 94    CBG:  Recent Labs Lab 04/11/14 0746 04/12/14 0749 04/13/14 0749  GLUCAP 101* 129* 87    Microbiology: No results found for this or any previous visit.  Coagulation Studies: No results for input(s): LABPROT, INR in the last 72 hours.  Imaging: Mr Sherrin DaisyBrain Wo Contrast  04/12/2014   CLINICAL DATA:  33 year old male with headache and current history of  factor 5 Leiden disorder. Retro-orbital constant pain. Blurred vision. Initial encounter.  EXAM: MRI HEAD WITHOUT CONTRAST  TECHNIQUE: Multiplanar, multiecho pulse sequences of the brain and surrounding structures were obtained without intravenous contrast.  COMPARISON:  Brain MRI 06/29/2012.  FINDINGS: The examination had to be discontinued just prior to completion due to patient claustrophobia, anxiety.  Major intracranial vascular flow voids are stable. Stable and normal cerebral volume. No restricted diffusion to suggest acute infarction. No midline shift, mass effect, evidence of mass lesion, ventriculomegaly,  extra-axial collection or acute intracranial hemorrhage. Cervicomedullary junction and pituitary are within normal limits. Negative visualized cervical spine.  Stable gray and white matter signal, within normal limits throughout the brain. No abnormal susceptibility identified in the brain.  Visible internal auditory structures appear normal. Visualized paranasal sinuses and mastoids are clear. Visualized orbit soft tissues are within normal limits. Bone marrow signal is stable and within normal limits. Visualized scalp soft tissues are within normal limits.  IMPRESSION: 1. Stable and negative non contrast MRI appearance of the brain. 2. No evidence of dural venous sinus thrombosis on these images, but a dedicated MRV or CTV of the Brain would be more sensitive.   Electronically Signed   By: Augusto GambleLee  Hall M.D.   On: 04/12/2014 20:40    Medications:  Scheduled: . antiseptic oral rinse  7 mL Mouth Rinse BID  . atorvastatin  20 mg Oral QPM  . heparin  5,000 Units Subcutaneous 3 times per day  . hydrOXYzine  50 mg Oral Daily  . metoprolol  50 mg Oral BID  . multivitamin with minerals  2 tablet Oral Daily  . pantoprazole  40 mg Oral Q1200  . prazosin  8 mg Oral QHS  . QUEtiapine  50 mg Oral QHS  . sodium chloride  3 mL Intravenous Q12H  . traZODone  100 mg Oral QHS  . Vitamin D (Ergocalciferol)  50,000 Units Oral Once per day on Mon Thu    Assessment/Plan:  HA resolved after Depakote and Mg+ administration. MRI negative. At this time no further recommendations. Neurology will S/O.     Felicie MornDavid Jessalynn Mccowan PA-C [facility] [phone]  04/13/2014, 9:16 AM

## 2014-04-13 NOTE — Discharge Summary (Signed)
PATIENT DETAILS Name: Harold Cohen Age: 33 y.o. Sex: male Date of Birth: Dec 27, 1980 MRN: [id number]. Admitting Physician: Lorretta HarpXilin Niu, MD NWG:NFAOZHY,QMVHQPCP:SANDERS,ROBYN N, MD  Admit Date: 04/10/2014 Discharge date: 04/13/2014  Recommendations for Outpatient Follow-up:  1. Cardiology to arrange for outpatient Cardiac MRI 2. [facility] neurology-Will call patient for appointment  PRIMARY DISCHARGE DIAGNOSIS:  Principal Problem:   Complicated migraine Active Problems:   Hx pulmonary embolism- 2010   Hypertensive cardiovascular disease   Hyperlipidemia   Tachycardia   Chest pain   Depression   Abnormal EKG-LVH, TWI      PAST MEDICAL HISTORY: Past Medical History  Diagnosis Date  . Pulmonary embolism   . Pleural effusion   . PTSD (post-traumatic stress disorder)   . Migraines   . Depression   . Anxiety   . Insomnia   . Clotting disorder   . Heart murmur   . Hyperlipidemia   . Hypertension   . Shortness of breath   . GERD (gastroesophageal reflux disease)   . TBI (traumatic brain injury) 2010  . Factor V Leiden     DISCHARGE MEDICATIONS: Current Discharge Medication List    CONTINUE these medications which have NOT CHANGED   Details  atorvastatin (LIPITOR) 20 MG tablet Take 20 mg by mouth every evening.     ergocalciferol (VITAMIN D2) 50000 UNITS capsule Take 50,000 Units by mouth 2 (two) times a week. Taken on Tuesdays and Thursdays.   Associated Diagnoses: Embolism    hydroxypropyl methylcellulose (ISOPTO TEARS) 2.5 % ophthalmic solution Place 2 drops into both eyes as needed for dry eyes.    HYDROXYZINE PAMOATE PO Take 50 mg by mouth daily.    ibuprofen (ADVIL,MOTRIN) 600 MG tablet Take 1 tablet (600 mg total) by mouth every 8 (eight) hours as needed. Take with food. Qty: 20 tablet, Refills: 0    lidocaine (LMX) 4 % cream Apply 1 application topically 3 (three) times daily as needed (anal fissures).    metoprolol (LOPRESSOR) 100 MG tablet Take 50 mg by mouth 2 (two)  times daily.     Multiple Vitamin (MULTIVITAMIN) tablet Take 2 tablets by mouth daily.   Associated Diagnoses: Embolism    omeprazole (PRILOSEC) 40 MG capsule Take 40 mg by mouth daily.    prazosin (MINIPRESS) 2 MG capsule Take 8 mg by mouth at bedtime.   Associated Diagnoses: Embolism    QUEtiapine (SEROQUEL) 25 MG tablet Take 2 tablets (50 mg total) by mouth at bedtime. Qty: 30 tablet, Refills: 0   Associated Diagnoses: Embolism    traZODone (DESYREL) 100 MG tablet Take 100 mg by mouth at bedtime.        ALLERGIES:  No Known Allergies  BRIEF HPI:  See H&P, Labs, Consult and Test reports for all details in brief, patient was admitted for evaluation of intractable headache and left-sided chest pain.  CONSULTATIONS:   cardiology and neurology  PERTINENT RADIOLOGIC STUDIES: Dg Chest 2 View  04/10/2014   CLINICAL DATA:  Left-sided chest pain with shortness of breath for the past 2 days; left lower leg pain for 2 weeks; history of DVT and pulmonary embolism  EXAM: CHEST  2 VIEW  COMPARISON:  PA and lateral chest of May 17, 2013  FINDINGS: The lungs are adequately inflated. There is no focal infiltrate. The heart and pulmonary vascularity are normal. The mediastinum is normal in width. There is no pleural effusion. The bony thorax is unremarkable.  IMPRESSION: There is no active cardiopulmonary disease.   Electronically Signed  By: David  SwazilandJordan   On: 04/10/2014 16:47   Ct Angio Chest Pe W/cm &/or Wo Cm  04/10/2014   CLINICAL DATA:  33 year old male with left chest pain and shortness of breath. Initial encounter.  EXAM: CT ANGIOGRAPHY CHEST WITH CONTRAST  TECHNIQUE: Multidetector CT imaging of the chest was performed using the standard protocol during bolus administration of intravenous contrast. Multiplanar CT image reconstructions and MIPs were obtained to evaluate the vascular anatomy.  CONTRAST:  100mL OMNIPAQUE IOHEXOL 350 MG/ML SOLN  COMPARISON:  04/10/2014 and prior chest  radiographs.  FINDINGS: This is a technically satisfactory study.  No pulmonary emboli identified.  There is no evidence of thoracic aortic aneurysm is or definite dissection.  Upper limits normal heart size noted.  There is no evidence of pleural/ pericardial effusion or enlarged lymph nodes.  Minimal bibasilar scarring again noted.  There is no evidence of airspace disease, consolidation, nodule, mass or endobronchial/endotracheal lesion.  There is no evidence of pneumothorax.  No acute or suspicious bony abnormalities are identified.  Fatty infiltration of the visualized liver is noted.  Review of the MIP images confirms the above findings.  IMPRESSION: No evidence of acute abnormality. No evidence of pulmonary emboli or thoracic aortic aneurysm/ definite dissection.  Fatty infiltration of the liver.   Electronically Signed   By: Laveda AbbeJeff  Hu M.D.   On: 04/10/2014 19:35   Mr Brain Wo Contrast  04/12/2014   CLINICAL DATA:  33 year old male with headache and current history of factor 5 Leiden disorder. Retro-orbital constant pain. Blurred vision. Initial encounter.  EXAM: MRI HEAD WITHOUT CONTRAST  TECHNIQUE: Multiplanar, multiecho pulse sequences of the brain and surrounding structures were obtained without intravenous contrast.  COMPARISON:  Brain MRI 06/29/2012.  FINDINGS: The examination had to be discontinued just prior to completion due to patient claustrophobia, anxiety.  Major intracranial vascular flow voids are stable. Stable and normal cerebral volume. No restricted diffusion to suggest acute infarction. No midline shift, mass effect, evidence of mass lesion, ventriculomegaly, extra-axial collection or acute intracranial hemorrhage. Cervicomedullary junction and pituitary are within normal limits. Negative visualized cervical spine.  Stable gray and white matter signal, within normal limits throughout the brain. No abnormal susceptibility identified in the brain.  Visible internal auditory structures  appear normal. Visualized paranasal sinuses and mastoids are clear. Visualized orbit soft tissues are within normal limits. Bone marrow signal is stable and within normal limits. Visualized scalp soft tissues are within normal limits.  IMPRESSION: 1. Stable and negative non contrast MRI appearance of the brain. 2. No evidence of dural venous sinus thrombosis on these images, but a dedicated MRV or CTV of the Brain would be more sensitive.   Electronically Signed   By: Augusto GambleLee  Hall M.D.   On: 04/12/2014 20:40   Ct Renal Stone Study  04/10/2014   CLINICAL DATA:  LEFT lower quadrant pain for 2 days.  Kidney stone.  EXAM: CT ABDOMEN AND PELVIS WITHOUT CONTRAST  TECHNIQUE: Multidetector CT imaging of the abdomen and pelvis was performed following the standard protocol without IV contrast.  COMPARISON:  09/30/2011.  FINDINGS: Musculoskeletal:  Normal.  Lung Bases: Atelectasis.  Liver: Hepatosteatosis with borderline hepatomegaly. Liver span is 18.8 cm.  Spleen:  Normal.  Gallbladder:  Contracted.  Common bile duct:  Normal.  Pancreas:  Normal.  Adrenal glands:  Normal bilaterally.  Kidneys: Normal. No stones or hydronephrosis. Both ureters appear within normal limits.  Stomach:  Normal.  Small bowel:  Normal.  Colon: Normal appendix.  Normal rectosigmoid. No inflammatory changes of colon.  Pelvic Genitourinary:  Collapsed.  Peritoneum: No free air or free fluid.  No mesenteric adenopathy.  Vasculature: Normal.  Body Wall: Fat containing periumbilical hernia.  IMPRESSION: 1. No acute abnormality. 2. Hepatosteatosis.   Electronically Signed   By: Andreas Newport M.D.   On: 04/10/2014 17:38     PERTINENT LAB RESULTS: CBC:  Recent Labs  04/10/14 1550 04/12/14 0353  WBC 8.5 7.7  HGB 15.5 13.5  HCT 45.7 41.1  PLT 309 286   CMET CMP     Component Value Date/Time   NA 141 04/10/2014 1550   K 4.0 04/10/2014 1550   CL 101 04/10/2014 1550   CO2 25 04/10/2014 1550   GLUCOSE 127* 04/10/2014 1550   BUN 10  04/10/2014 1550   CREATININE 1.00 04/10/2014 1550   CALCIUM 9.8 04/10/2014 1550   PROT 8.0 04/10/2014 1550   ALBUMIN 4.1 04/10/2014 1550   AST 23 04/10/2014 1550   ALT 22 04/10/2014 1550   ALKPHOS 62 04/10/2014 1550   BILITOT <0.2* 04/10/2014 1550   GFRNONAA >90 04/10/2014 1550   GFRAA >90 04/10/2014 1550    GFR Estimated Creatinine Clearance: 125.4 mL/min (by C-G formula based on Cr of 1).  Recent Labs  04/11/14 0543  LIPASE 24    Recent Labs  04/11/14 0543 04/11/14 1130 04/11/14 2138  TROPONINI <0.30 <0.30 <0.30   Invalid input(s): POCBNP  Recent Labs  04/10/14 1550  DDIMER <0.27   No results for input(s): HGBA1C in the last 72 hours.  Recent Labs  04/12/14 0353  CHOL 189  HDL 30*  LDLCALC 94  TRIG 161*  CHOLHDL 6.3    Recent Labs  04/10/14 1845  TSH PERFORMED AT [facility]   No results for input(s): VITAMINB12, FOLATE, FERRITIN, TIBC, IRON, RETICCTPCT in the last 72 hours. Coags: No results for input(s): INR in the last 72 hours.  Invalid input(s): PT Microbiology: No results found for this or any previous visit (from the past 240 hour(s)).   BRIEF HOSPITAL COURSE:   Principal Problem:   Complicated migraine:known hx of migraine-admitted with worsening headache for the past few days-with photophobia/phonophobia. Only briefly relieved by cocktail IV Toradol, Reglan and Benadryl.Seen by Neurology, given Depakote IV-with resolution of Headache. MRI Brain negative for acute cva. Per Neuro, since <4 episodes/month no role for prophylactic therapy at this point. [facility] neurology will call patient for follow-up appointment.  Active Problems:   Chest Pain:clearly atypical-musculoskeletal-easily reproducible on palpations.Troponin so far negative. CTA chest and lower extremity Doppler negative. Per cards outpatient note- Stress Echo in 2013 negative. Consulted cards, Repeat Echo essentially unchanged. Suspect no further work up required  inpatient. Cardiology to arrange for outpatient cardiac MRI.  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 CTA chest neg for PE,TSH pending. Defer further work up to the outpatient setting.Claims he was referred to [facility] by Dr Sharion Dove saw a cardiologist there that did not think patient had POTS (although I dont see documentation). Further workup deferred to the outpatient setting, cardiology to arrange for outpatient cardiac MRI.  WRU:[id number], continue Metoprolol and Prazosin.  Psych issues: Including depression and PTSD. No suicidal or homicidal ideations on admission.Continue home meds: Seroquel and trazodone  TODAY-DAY OF DISCHARGE:  Subjective:   Harold Cohen today has no headache,no chest abdominal pain,no new weakness tingling or numbness, feels much better wants to go home today.   Objective:  Blood pressure 152/86, pulse 83, temperature 98.2 F (36.8 C), temperature source Oral, resp. rate 16, height 6' (1.829 m), weight 96.7 kg (213 lb 3 oz), SpO2 98 %.  Intake/Output Summary (Last 24 hours) at 04/13/14 0936 Last data filed at 04/12/14 2201  Gross per 24 hour  Intake    243 ml  Output   1951 ml  Net  -1708 ml   Filed Weights   04/11/14 0342 04/12/14 0540 04/13/14 0405  Weight: 95.89 kg (211 lb 6.4 oz) 95.709 kg (211 lb) 96.7 kg (213 lb 3 oz)    Exam Awake Alert, Oriented *3, No new F.N deficits, Normal affect [place].AT,PERRAL Supple Neck,No JVD, No cervical lymphadenopathy appriciated.  Symmetrical Chest wall movement, Good air movement bilaterally, CTAB RRR,No Gallops,Rubs or new Murmurs, No Parasternal Heave +ve B.Sounds, Abd Soft, Non tender, No organomegaly appriciated, No rebound -guarding or rigidity. No Cyanosis, Clubbing or edema, No new Rash or bruise  DISCHARGE CONDITION: Stable  DISPOSITION: Home  DISCHARGE INSTRUCTIONS:    Activity:  As tolerated  Diet recommendation: Heart Healthy  diet  Discharge Instructions    Call MD for:    Complete by:  As directed   Intractable Headache     Diet - low sodium heart healthy    Complete by:  As directed      Increase activity slowly    Complete by:  As directed            Follow-up Information    Follow up with Gwynneth Aliment, MD. Schedule an appointment as soon as possible for a visit in 1 week.   Specialty:  Internal Medicine   Contact information:   [address] [place] [address] [phone]       Follow up with HILTY,Kenneth C, MD. Schedule an appointment as soon as possible for a visit in 2 weeks.   Specialty:  Cardiology   Contact information:   [address] [place] [address] [place] [address] [phone]       Follow up with [facility].   Why:  office will call you with a appt   Contact information:   [address] [place] [address] [phone]        Total Time spent on discharge equals 45 minutes.  SignedJeoffrey Massed 04/13/2014 9:36 AM

## 2014-04-13 NOTE — Progress Notes (Signed)
PATIENT DETAILS Name: Harold Cohen Age: 33 y.o. Sex: male Date of Birth: 10-Nov-1980 Admit Date: 04/10/2014 Admitting Physician Lorretta Harp, MD ZOX:[id number],UJWJX N, MD  Subjective: Feels much better-anxious to go home.   Assessment/Plan: Principal Problem:   Complicated migraine:known hx of migraine-admitted with worsening headache for the past few days-with photophobia/phonophobia. Only briefly relieved by cocktail IV Toradol, Reglan and Benadryl.Seen by Neurology, given Depakote IV-with resolution of Headache. MRI Brain negative for acute cva. Per Neuro, since <4 episodes/month no role for prophylactic therapy at this point.   Active Problems:   Chest Pain:clearly atypical-musculoskeletal-easily reproducible on palpations.Troponin so far negative. Per cards outpatient note- Stress Echo in 2013 negative. Consulted cards, Repeat Echo essentially unchanged. Suspect no further work up required inpatient.   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 CTA chest neg for PE,TSH pending. Defer further work up to the outpatient setting.Claims he was referred to [facility] by Dr Sharion Dove saw a cardiologist there that did not think patient had POTS (although I dont see documentation) Monitor in Telemetry   BJY:NWGNFAOZHY, continue Metoprolol and Prazosin.   Psych issues: Including depression and PTSD. No suicidal or homicidal ideations on admission.Continue home meds: Seroquel and trazodone  ?Black tarry stools:none since admission, monitor CBC-if stable-no need for any inpatient work up    Dyslipidemia:c/w Statins  Disposition: Remain inpatient-Home if ok with cards/Neuro  Antibiotics:  None   Anti-infectives    None      DVT Prophylaxis: Prophylactic Heparin   Code Status: Full code   Family Communication Spouse at bedside 12/2  Procedures:  None  CONSULTS:  None  MEDICATIONS: Scheduled Meds: . antiseptic oral  rinse  7 mL Mouth Rinse BID  . atorvastatin  20 mg Oral QPM  . heparin  5,000 Units Subcutaneous 3 times per day  . hydrOXYzine  50 mg Oral Daily  . metoprolol  50 mg Oral BID  . multivitamin with minerals  2 tablet Oral Daily  . pantoprazole  40 mg Oral Q1200  . prazosin  8 mg Oral QHS  . QUEtiapine  50 mg Oral QHS  . sodium chloride  3 mL Intravenous Q12H  . traZODone  100 mg Oral QHS  . Vitamin D (Ergocalciferol)  50,000 Units Oral Once per day on Mon Thu   Continuous Infusions:  PRN Meds:.lidocaine, ondansetron **OR** ondansetron (ZOFRAN) IV, polyvinyl alcohol    PHYSICAL EXAM: Vital signs in last 24 hours: Filed Vitals:   04/12/14 1156 04/12/14 2021 04/13/14 0405 04/13/14 0828  BP: 117/71 143/78 133/62 152/86  Pulse: 81 87 70 83  Temp:  98.2 F (36.8 C) 97.6 F (36.4 C) 98.2 F (36.8 C)  TempSrc:  Oral Oral Oral  Resp: 16 18 19 16   Height:      Weight:   96.7 kg (213 lb 3 oz)   SpO2: 97% 96% 95% 98%    Weight change: 0.991 kg (2 lb 3 oz) Filed Weights   04/11/14 0342 04/12/14 0540 04/13/14 0405  Weight: 95.89 kg (211 lb 6.4 oz) 95.709 kg (211 lb) 96.7 kg (213 lb 3 oz)   Body mass index is 28.91 kg/(m^2).   Gen Exam: Awake and alert with clear speech.   Neck: Supple, No JVD.   Chest: B/L Clear.   CVS: S1 S2 Regular, no murmurs.  Abdomen: soft, BS +, non tender, non distended.  Extremities: no edema, lower extremities warm to touch.  Neurologic: Non Focal.   Skin: No Rash.   Wounds: N/A.   Intake/Output from previous day:  Intake/Output Summary (Last 24 hours) at 04/13/14 0834 Last data filed at 04/12/14 2201  Gross per 24 hour  Intake    243 ml  Output   1951 ml  Net  -1708 ml     LAB RESULTS: CBC  Recent Labs Lab 04/10/14 1550 04/12/14 0353  WBC 8.5 7.7  HGB 15.5 13.5  HCT 45.7 41.1  PLT 309 286  MCV 85.3 86.0  MCH 28.9 28.2  MCHC 33.9 32.8  RDW 13.0 13.0    Chemistries   Recent Labs Lab 04/10/14 1550  NA 141  K 4.0  CL 101    CO2 25  GLUCOSE 127*  BUN 10  CREATININE 1.00  CALCIUM 9.8    CBG:  Recent Labs Lab 04/11/14 0746 04/12/14 0749 04/13/14 0749  GLUCAP 101* 129* 87    GFR Estimated Creatinine Clearance: 125.4 mL/min (by C-G formula based on Cr of 1).  Coagulation profile No results for input(s): INR, PROTIME in the last 168 hours.  Cardiac Enzymes  Recent Labs Lab 04/11/14 0543 04/11/14 1130 04/11/14 2138  TROPONINI <0.30 <0.30 <0.30    Invalid input(s): POCBNP  Recent Labs  04/10/14 1550  DDIMER <0.27   No results for input(s): HGBA1C in the last 72 hours.  Recent Labs  04/12/14 0353  CHOL 189  HDL 30*  LDLCALC 94  TRIG 161327*  CHOLHDL 6.3    Recent Labs  04/10/14 1845  TSH PERFORMED AT [facility]   No results for input(s): VITAMINB12, FOLATE, FERRITIN, TIBC, IRON, RETICCTPCT in the last 72 hours.  Recent Labs  04/11/14 0543  LIPASE 24    Urine Studies No results for input(s): UHGB, CRYS in the last 72 hours.  Invalid input(s): UACOL, UAPR, USPG, UPH, UTP, UGL, UKET, UBIL, UNIT, UROB, ULEU, UEPI, UWBC, URBC, UBAC, CAST, UCOM, BILUA  MICROBIOLOGY: No results found for this or any previous visit (from the past 240 hour(s)).  RADIOLOGY STUDIES/RESULTS: Dg Chest 2 View  04/10/2014   CLINICAL DATA:  Left-sided chest pain with shortness of breath for the past 2 days; left lower leg pain for 2 weeks; history of DVT and pulmonary embolism  EXAM: CHEST  2 VIEW  COMPARISON:  PA and lateral chest of May 17, 2013  FINDINGS: The lungs are adequately inflated. There is no focal infiltrate. The heart and pulmonary vascularity are normal. The mediastinum is normal in width. There is no pleural effusion. The bony thorax is unremarkable.  IMPRESSION: There is no active cardiopulmonary disease.   Electronically Signed   By: David  SwazilandJordan   On: 04/10/2014 16:47   Ct Angio Chest Pe W/cm &/or Wo Cm  04/10/2014   CLINICAL DATA:  33 year old male with left  chest pain and shortness of breath. Initial encounter.  EXAM: CT ANGIOGRAPHY CHEST WITH CONTRAST  TECHNIQUE: Multidetector CT imaging of the chest was performed using the standard protocol during bolus administration of intravenous contrast. Multiplanar CT image reconstructions and MIPs were obtained to evaluate the vascular anatomy.  CONTRAST:  100mL OMNIPAQUE IOHEXOL 350 MG/ML SOLN  COMPARISON:  04/10/2014 and prior chest radiographs.  FINDINGS: This is a technically satisfactory study.  No pulmonary emboli identified.  There is no evidence of thoracic aortic aneurysm is or definite dissection.  Upper limits normal heart size noted.  There is no evidence of pleural/ pericardial effusion or enlarged lymph nodes.  Minimal  bibasilar scarring again noted.  There is no evidence of airspace disease, consolidation, nodule, mass or endobronchial/endotracheal lesion.  There is no evidence of pneumothorax.  No acute or suspicious bony abnormalities are identified.  Fatty infiltration of the visualized liver is noted.  Review of the MIP images confirms the above findings.  IMPRESSION: No evidence of acute abnormality. No evidence of pulmonary emboli or thoracic aortic aneurysm/ definite dissection.  Fatty infiltration of the liver.   Electronically Signed   By: Laveda AbbeJeff  Hu M.D.   On: 04/10/2014 19:35   Mr Brain Wo Contrast  04/12/2014   CLINICAL DATA:  33 year old male with headache and current history of factor 5 Leiden disorder. Retro-orbital constant pain. Blurred vision. Initial encounter.  EXAM: MRI HEAD WITHOUT CONTRAST  TECHNIQUE: Multiplanar, multiecho pulse sequences of the brain and surrounding structures were obtained without intravenous contrast.  COMPARISON:  Brain MRI 06/29/2012.  FINDINGS: The examination had to be discontinued just prior to completion due to patient claustrophobia, anxiety.  Major intracranial vascular flow voids are stable. Stable and normal cerebral volume. No restricted diffusion to  suggest acute infarction. No midline shift, mass effect, evidence of mass lesion, ventriculomegaly, extra-axial collection or acute intracranial hemorrhage. Cervicomedullary junction and pituitary are within normal limits. Negative visualized cervical spine.  Stable gray and white matter signal, within normal limits throughout the brain. No abnormal susceptibility identified in the brain.  Visible internal auditory structures appear normal. Visualized paranasal sinuses and mastoids are clear. Visualized orbit soft tissues are within normal limits. Bone marrow signal is stable and within normal limits. Visualized scalp soft tissues are within normal limits.  IMPRESSION: 1. Stable and negative non contrast MRI appearance of the brain. 2. No evidence of dural venous sinus thrombosis on these images, but a dedicated MRV or CTV of the Brain would be more sensitive.   Electronically Signed   By: Augusto GambleLee  Hall M.D.   On: 04/12/2014 20:40   Ct Renal Stone Study  04/10/2014   CLINICAL DATA:  LEFT lower quadrant pain for 2 days.  Kidney stone.  EXAM: CT ABDOMEN AND PELVIS WITHOUT CONTRAST  TECHNIQUE: Multidetector CT imaging of the abdomen and pelvis was performed following the standard protocol without IV contrast.  COMPARISON:  09/30/2011.  FINDINGS: Musculoskeletal:  Normal.  Lung Bases: Atelectasis.  Liver: Hepatosteatosis with borderline hepatomegaly. Liver span is 18.8 cm.  Spleen:  Normal.  Gallbladder:  Contracted.  Common bile duct:  Normal.  Pancreas:  Normal.  Adrenal glands:  Normal bilaterally.  Kidneys: Normal. No stones or hydronephrosis. Both ureters appear within normal limits.  Stomach:  Normal.  Small bowel:  Normal.  Colon: Normal appendix. Normal rectosigmoid. No inflammatory changes of colon.  Pelvic Genitourinary:  Collapsed.  Peritoneum: No free air or free fluid.  No mesenteric adenopathy.  Vasculature: Normal.  Body Wall: Fat containing periumbilical hernia.  IMPRESSION: 1. No acute abnormality. 2.  Hepatosteatosis.   Electronically Signed   By: Andreas NewportGeoffrey  Lamke M.D.   On: 04/10/2014 17:38    Jeoffrey MassedGHIMIRE,Tyren Dugar, MD  [facility] Pager:[phone]  If 7PM-7AM, please contact night-coverage [email or web address] Password [email or web address] 04/13/2014, 8:34 AM   LOS: 3 days

## 2014-04-13 NOTE — Progress Notes (Signed)
Message has been sent to a scheduler to assist in scheduling this test.

## 2014-04-16 ENCOUNTER — Encounter: Payer: Self-pay | Admitting: Internal Medicine

## 2014-04-17 NOTE — Progress Notes (Signed)
Scheduled 12/15 for cardiac MRI

## 2014-04-24 ENCOUNTER — Ambulatory Visit (HOSPITAL_COMMUNITY): Admission: RE | Admit: 2014-04-24 | Source: Ambulatory Visit

## 2014-04-25 ENCOUNTER — Telehealth: Payer: Self-pay | Admitting: Internal Medicine

## 2014-04-25 NOTE — Telephone Encounter (Signed)
Closed enounter °

## 2014-04-26 NOTE — Progress Notes (Signed)
Patient was a "no show" for his cardiac MRI and has not been able to be reached to make an OV (per scheduling)

## 2014-05-01 ENCOUNTER — Encounter: Payer: Self-pay | Admitting: Internal Medicine

## 2014-05-16 ENCOUNTER — Ambulatory Visit (HOSPITAL_COMMUNITY): Admission: RE | Admit: 2014-05-16 | Source: Ambulatory Visit
